# Patient Record
Sex: Male | Born: 2000 | Race: White | Hispanic: No | Marital: Single | Smoking: Current some day smoker
Health system: Southern US, Community
[De-identification: ages and names within clinical notes are randomized; demographics above are authoritative.]

## PROBLEM LIST (undated history)

## (undated) ENCOUNTER — Emergency Department (HOSPITAL_COMMUNITY): Admission: EM | Payer: BC Managed Care – PPO | Source: Home / Self Care

## (undated) DIAGNOSIS — G43909 Migraine, unspecified, not intractable, without status migrainosus: Secondary | ICD-10-CM

## (undated) DIAGNOSIS — F419 Anxiety disorder, unspecified: Secondary | ICD-10-CM

## (undated) DIAGNOSIS — I10 Essential (primary) hypertension: Secondary | ICD-10-CM

## (undated) HISTORY — DX: Migraine, unspecified, not intractable, without status migrainosus: G43.909

---
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
## 2021-03-24 ENCOUNTER — Other Ambulatory Visit: Payer: Self-pay

## 2021-03-24 ENCOUNTER — Emergency Department (HOSPITAL_COMMUNITY)
Admission: EM | Admit: 2021-03-24 | Discharge: 2021-03-25 | Disposition: A | Discharge status: Other Acute Inpatient Hospital | Payer: BC Managed Care – PPO | Source: Home / Self Care | Attending: Emergency Medicine | Admitting: Emergency Medicine

## 2021-03-24 ENCOUNTER — Encounter (HOSPITAL_COMMUNITY): Payer: Self-pay | Admitting: Emergency Medicine

## 2021-03-24 DIAGNOSIS — Z20822 Contact with and (suspected) exposure to covid-19: Secondary | ICD-10-CM | POA: Insufficient documentation

## 2021-03-24 DIAGNOSIS — R45851 Suicidal ideations: Secondary | ICD-10-CM | POA: Insufficient documentation

## 2021-03-24 DIAGNOSIS — F1290 Cannabis use, unspecified, uncomplicated: Secondary | ICD-10-CM | POA: Insufficient documentation

## 2021-03-24 DIAGNOSIS — F419 Anxiety disorder, unspecified: Secondary | ICD-10-CM | POA: Insufficient documentation

## 2021-03-24 DIAGNOSIS — R45850 Homicidal ideations: Secondary | ICD-10-CM | POA: Insufficient documentation

## 2021-03-24 DIAGNOSIS — I10 Essential (primary) hypertension: Secondary | ICD-10-CM | POA: Insufficient documentation

## 2021-03-24 DIAGNOSIS — F332 Major depressive disorder, recurrent severe without psychotic features: Secondary | ICD-10-CM | POA: Insufficient documentation

## 2021-03-24 HISTORY — DX: Anxiety disorder, unspecified: F41.9

## 2021-03-24 HISTORY — DX: Essential (primary) hypertension: I10

## 2021-03-24 LAB — RAPID URINE DRUG SCREEN, HOSP PERFORMED
Amphetamines: NOT DETECTED
Barbiturates: NOT DETECTED
Benzodiazepines: NOT DETECTED
Cocaine: NOT DETECTED
Opiates: NOT DETECTED
Tetrahydrocannabinol: POSITIVE — AB

## 2021-03-24 LAB — COMPREHENSIVE METABOLIC PANEL
ALT: 15 U/L (ref 0–44)
AST: 17 U/L (ref 15–41)
Albumin: 4.7 g/dL (ref 3.5–5.0)
Alkaline Phosphatase: 63 U/L (ref 38–126)
Anion gap: 8 (ref 5–15)
BUN: 6 mg/dL (ref 6–20)
CO2: 30 mmol/L (ref 22–32)
Calcium: 10 mg/dL (ref 8.9–10.3)
Chloride: 102 mmol/L (ref 98–111)
Creatinine, Ser: 0.9 mg/dL (ref 0.61–1.24)
GFR, Estimated: >60 mL/min (ref >60)
Glucose, Bld: 89 mg/dL (ref 70–99)
Potassium: 3.4 mmol/L — ABNORMAL LOW (ref 3.5–5.1)
Sodium: 140 mmol/L (ref 135–145)
Total Bilirubin: 2.3 mg/dL — ABNORMAL HIGH (ref 0.3–1.2)
Total Protein: 7.3 g/dL (ref 6.5–8.1)

## 2021-03-24 LAB — CBC WITH DIFFERENTIAL/PLATELET
Abs Immature Granulocytes: 0.05 10*3/uL (ref 0.00–0.07)
Basophils Absolute: 0.1 10*3/uL (ref 0.0–0.1)
Basophils Relative: 1 %
Eosinophils Absolute: 0 10*3/uL (ref 0.0–0.5)
Eosinophils Relative: 1 %
HCT: 49.6 % (ref 39.0–52.0)
Hemoglobin: 16.9 g/dL (ref 13.0–17.0)
Immature Granulocytes: 1 %
Lymphocytes Relative: 30 %
Lymphs Abs: 1.8 10*3/uL (ref 0.7–4.0)
MCH: 30.9 pg (ref 26.0–34.0)
MCHC: 34.1 g/dL (ref 30.0–36.0)
MCV: 90.7 fL (ref 80.0–100.0)
Monocytes Absolute: 0.5 10*3/uL (ref 0.1–1.0)
Monocytes Relative: 8 %
Neutro Abs: 3.5 10*3/uL (ref 1.7–7.7)
Neutrophils Relative %: 59 %
Platelets: 246 10*3/uL (ref 150–400)
RBC: 5.47 MIL/uL (ref 4.22–5.81)
RDW: 12.7 % (ref 11.5–15.5)
WBC: 6 10*3/uL (ref 4.0–10.5)
nRBC: 0 % (ref 0.0–0.2)

## 2021-03-24 LAB — RESP PANEL BY RT-PCR (FLU A&B, COVID) ARPGX2
Influenza A by PCR: NEGATIVE
Influenza B by PCR: NEGATIVE
SARS Coronavirus 2 by RT PCR: NEGATIVE

## 2021-03-24 LAB — ETHANOL: Alcohol, Ethyl (B): <10 mg/dL (ref <10)

## 2021-03-24 NOTE — ED Provider Notes (Signed)
Emergency Medicine Provider Triage Evaluation Note  [name] , a 20 y.o. male  was evaluated in triage.  Pt complains of SI/HI thoughts.  Patient actively has plan to shoot himself, however denies access to firearms.  States he also has intrusive thoughts about hurting other people.  Denies any attempt to do so at this time.  Denies auditory or visual hallucinations.  Review of Systems  Positive: SI/HI thoughts Negative: Auditory or visual hallucinations  Physical Exam  BP (!) 141/92   Pulse 85   Temp 98.4 F (36.9 C) (Oral)   Resp 18   Ht 6' (1.829 m)   Wt 92 kg   SpO2 100%   BMI 27.51 kg/m  Gen:   Awake, no distress   Resp:  Normal effort  MSK:   Moves extremities without difficulty    Medical Decision Making  Medically screening exam initiated at 9:36 PM.  Appropriate orders placed.  [name] was informed that the remainder of the evaluation will be completed by another provider, this initial triage assessment does not replace that evaluation, and the importance of remaining in the ED until their evaluation is complete.     Vear Clock 03/24/21 2138    Virgina Norfolk, DO 03/24/21 2311

## 2021-03-24 NOTE — ED Triage Notes (Signed)
Patient arrived with EMS from home reports suicidal ideation this week , he did not specify his plan at triage , denies hallucinations .

## 2021-03-24 NOTE — ED Notes (Signed)
William [name] [phone] would like an update

## 2021-03-25 ENCOUNTER — Other Ambulatory Visit: Payer: Self-pay | Admitting: Psychiatry

## 2021-03-25 ENCOUNTER — Encounter (HOSPITAL_COMMUNITY): Payer: Self-pay | Admitting: Emergency Medicine

## 2021-03-25 ENCOUNTER — Inpatient Hospital Stay (HOSPITAL_COMMUNITY)
Admission: AD | Admit: 2021-03-25 | Discharge: 2021-04-01 | DRG: 885 | Disposition: A | Discharge status: Home / Self Care | Payer: BC Managed Care – PPO | Source: Intra-hospital | Attending: Emergency Medicine | Admitting: Emergency Medicine

## 2021-03-25 DIAGNOSIS — R45850 Homicidal ideations: Secondary | ICD-10-CM | POA: Diagnosis present

## 2021-03-25 DIAGNOSIS — I10 Essential (primary) hypertension: Secondary | ICD-10-CM | POA: Diagnosis present

## 2021-03-25 DIAGNOSIS — G43909 Migraine, unspecified, not intractable, without status migrainosus: Secondary | ICD-10-CM | POA: Diagnosis present

## 2021-03-25 DIAGNOSIS — Z20822 Contact with and (suspected) exposure to covid-19: Secondary | ICD-10-CM | POA: Diagnosis present

## 2021-03-25 DIAGNOSIS — R45851 Suicidal ideations: Secondary | ICD-10-CM | POA: Diagnosis present

## 2021-03-25 DIAGNOSIS — F1290 Cannabis use, unspecified, uncomplicated: Secondary | ICD-10-CM | POA: Diagnosis present

## 2021-03-25 DIAGNOSIS — Z801 Family history of malignant neoplasm of trachea, bronchus and lung: Secondary | ICD-10-CM

## 2021-03-25 DIAGNOSIS — F332 Major depressive disorder, recurrent severe without psychotic features: Principal | ICD-10-CM | POA: Diagnosis present

## 2021-03-25 DIAGNOSIS — F23 Brief psychotic disorder: Principal | ICD-10-CM | POA: Diagnosis present

## 2021-03-25 MED ORDER — ONDANSETRON HCL 4 MG PO TABS: 4.0000 mg | ORAL_TABLET | Freq: Three times a day (TID) | ORAL | Status: DC | PRN

## 2021-03-25 MED ORDER — ACETAMINOPHEN 325 MG PO TABS: 650.0000 mg | ORAL_TABLET | ORAL | Status: DC | PRN

## 2021-03-25 MED ORDER — HYDROXYZINE HCL 25 MG PO TABS
25.0000 mg | ORAL_TABLET | Freq: Three times a day (TID) | ORAL | Status: DC | PRN
  Administered 2021-03-25 – 2021-03-31 (×15): 25 mg via ORAL
  Filled 2021-03-25 (×15): qty 1

## 2021-03-25 MED ORDER — ALUM & MAG HYDROXIDE-SIMETH 200-200-20 MG/5ML PO SUSP: 30.0000 mL | Freq: Four times a day (QID) | ORAL | Status: DC | PRN

## 2021-03-25 MED ORDER — NICOTINE POLACRILEX 2 MG MT GUM
2.0000 mg | CHEWING_GUM | OROMUCOSAL | Status: DC | PRN
  Administered 2021-03-25 – 2021-04-01 (×29): 2 mg via ORAL
  Filled 2021-03-25 (×12): qty 1
  Filled 2021-03-25: qty 10
  Filled 2021-03-25: qty 1

## 2021-03-25 NOTE — BH Assessment (Addendum)
Patient accepted to [facility] by Nira Conn, FNP and attending will be Dr. Mason Jim.  Nurse call report to [phone].  Pt can come after 09:00.

## 2021-03-25 NOTE — Tx Team (Signed)
Initial Treatment Plan 03/25/2021 2:23 PM William [name] OVZ:[id number]    PATIENT STRESSORS: Financial difficulties   Health problems   Medication change or noncompliance   Occupational concerns     PATIENT STRENGTHS: Ability for insight  Communication skills  Motivation for treatment/growth  Supportive family/friends    PATIENT IDENTIFIED PROBLEMS: "Fix my intrusive thoughts"  Suicidal thoughts  Depression  Anxiety               DISCHARGE CRITERIA:  Ability to meet basic life and health needs Adequate post-discharge living arrangements Motivation to continue treatment in a less acute level of care  PRELIMINARY DISCHARGE PLAN: Attend aftercare/continuing care group Outpatient therapy Return to previous living arrangement  PATIENT/FAMILY INVOLVEMENT: This treatment plan has been presented to and reviewed with the patient, William [name], and/or family member.  The patient and family have been given the opportunity to ask questions and make suggestions.  Clarene Critchley, RN 03/25/2021, 2:23 PM

## 2021-03-25 NOTE — ED Provider Notes (Signed)
[facility] EMERGENCY DEPARTMENT Provider Note   CSN: [id number] Arrival date & time: 03/24/21  2103     History Chief Complaint  Patient presents with  . Suicidal    William [name] is a 20 y.o. male.  The history is provided by the patient.  Mental Health Problem Presenting symptoms: homicidal ideas and suicidal thoughts   Presenting symptoms: no aggressive behavior and no suicide attempt   Patient accompanied by: none. Degree of incapacity (severity):  Moderate Onset quality:  Gradual Duration: days. Timing:  Constant Progression:  Unchanged Chronicity:  New Context: not alcohol use and not noncompliant   Treatment compliance:  Untreated Time since last dose of psychoactive medication: never. Worsened by:  Nothing Ineffective treatments:  None tried Associated symptoms: no decreased need for sleep and no euphoric mood   Risk factors: no hx of suicide attempts       Past Medical History:  Diagnosis Date  . Anxiety   . Hypertension   . Migraine     There are no problems to display for this patient.   No past surgical history on file.     No family history on file.  Social History   Tobacco Use  . Smoking status: Never  Substance Use Topics  . Alcohol use: Never  . Drug use: Never    Home Medications Prior to Admission medications   Medication Sig Start Date End Date Taking? Authorizing Provider  naproxen (NAPROSYN) 500 MG tablet Take 1 tablet (500 mg total) by mouth 2 (two) times daily with a meal. 08/06/14   Didiano, Deanna M, DO    Allergies    Patient has no known allergies.  Review of Systems   Review of Systems  Constitutional:  Negative for fever.  HENT:  Negative for facial swelling.   Eyes:  Negative for redness.  Respiratory:  Negative for wheezing and stridor.   Cardiovascular:  Negative for leg swelling.  Gastrointestinal:  Negative for nausea and vomiting.  Genitourinary:  Negative for difficulty urinating.   Musculoskeletal:  Negative for neck stiffness.  Skin:  Negative for rash.  Neurological:  Negative for facial asymmetry.  Psychiatric/Behavioral:  Positive for homicidal ideas and suicidal ideas.   All other systems reviewed and are negative.  Physical Exam Updated Vital Signs BP (!) 141/92   Pulse 85   Temp 98.4 F (36.9 C) (Oral)   Resp 18   Ht 6' (1.829 m)   Wt 92 kg   SpO2 100%   BMI 27.51 kg/m   Physical Exam Vitals and nursing note reviewed.  Constitutional:      General: He is not in acute distress.    Appearance: Normal appearance.  HENT:     Head: Normocephalic and atraumatic.     Nose: Nose normal.  Eyes:     Conjunctiva/sclera: Conjunctivae normal.     Pupils: Pupils are equal, round, and reactive to light.  Cardiovascular:     Rate and Rhythm: Normal rate and regular rhythm.     Pulses: Normal pulses.     Heart sounds: Normal heart sounds.  Pulmonary:     Effort: Pulmonary effort is normal.     Breath sounds: Normal breath sounds.  Abdominal:     General: Abdomen is flat. Bowel sounds are normal.     Palpations: Abdomen is soft.     Tenderness: There is no abdominal tenderness. There is no guarding.  Musculoskeletal:        General: Normal  range of motion.     Cervical back: Normal range of motion and neck supple.  Skin:    General: Skin is warm and dry.     Capillary Refill: Capillary refill takes less than 2 seconds.  Neurological:     General: No focal deficit present.     Mental Status: He is alert and oriented to person, place, and time.     Deep Tendon Reflexes: Reflexes normal.  Psychiatric:        Mood and Affect: Mood normal.        Behavior: Behavior normal.    ED Results / Procedures / Treatments   Labs (all labs ordered are listed, but only abnormal results are displayed) Results for orders placed or performed during the hospital encounter of 03/24/21  Resp Panel by RT-PCR (Flu A&B, Covid) Nasopharyngeal Swab   Specimen:  Nasopharyngeal Swab; Nasopharyngeal(NP) swabs in vial transport medium  Result Value Ref Range   SARS Coronavirus 2 by RT PCR NEGATIVE NEGATIVE   Influenza A by PCR NEGATIVE NEGATIVE   Influenza B by PCR NEGATIVE NEGATIVE  Comprehensive metabolic panel  Result Value Ref Range   Sodium 140 135 - 145 mmol/L   Potassium 3.4 (L) 3.5 - 5.1 mmol/L   Chloride 102 98 - 111 mmol/L   CO2 30 22 - 32 mmol/L   Glucose, Bld 89 70 - 99 mg/dL   BUN 6 6 - 20 mg/dL   Creatinine, Ser 7.62 0.61 - 1.24 mg/dL   Calcium 83.1 8.9 - 51.7 mg/dL   Total Protein 7.3 6.5 - 8.1 g/dL   Albumin 4.7 3.5 - 5.0 g/dL   AST 17 15 - 41 U/L   ALT 15 0 - 44 U/L   Alkaline Phosphatase 63 38 - 126 U/L   Total Bilirubin 2.3 (H) 0.3 - 1.2 mg/dL   GFR, Estimated >61 >60 mL/min   Anion gap 8 5 - 15  Ethanol  Result Value Ref Range   Alcohol, Ethyl (B) <10 <10 mg/dL  Urine rapid drug screen (hosp performed)  Result Value Ref Range   Opiates NONE DETECTED NONE DETECTED   Cocaine NONE DETECTED NONE DETECTED   Benzodiazepines NONE DETECTED NONE DETECTED   Amphetamines NONE DETECTED NONE DETECTED   Tetrahydrocannabinol POSITIVE (A) NONE DETECTED   Barbiturates NONE DETECTED NONE DETECTED  CBC with Diff  Result Value Ref Range   WBC 6.0 4.0 - 10.5 K/uL   RBC 5.47 4.22 - 5.81 MIL/uL   Hemoglobin 16.9 13.0 - 17.0 g/dL   HCT 73.7 10.6 - 26.9 %   MCV 90.7 80.0 - 100.0 fL   MCH 30.9 26.0 - 34.0 pg   MCHC 34.1 30.0 - 36.0 g/dL   RDW 48.5 46.2 - 70.3 %   Platelets 246 150 - 400 K/uL   nRBC 0.0 0.0 - 0.2 %   Neutrophils Relative % 59 %   Neutro Abs 3.5 1.7 - 7.7 K/uL   Lymphocytes Relative 30 %   Lymphs Abs 1.8 0.7 - 4.0 K/uL   Monocytes Relative 8 %   Monocytes Absolute 0.5 0.1 - 1.0 K/uL   Eosinophils Relative 1 %   Eosinophils Absolute 0.0 0.0 - 0.5 K/uL   Basophils Relative 1 %   Basophils Absolute 0.1 0.0 - 0.1 K/uL   Immature Granulocytes 1 %   Abs Immature Granulocytes 0.05 0.00 - 0.07 K/uL   No results  found.  Procedures Procedures   Medications Ordered in ED Medications  alum &  mag hydroxide-simeth (MAALOX/MYLANTA) 200-200-20 MG/5ML suspension 30 mL (has no administration in time range)  acetaminophen (TYLENOL) tablet 650 mg (has no administration in time range)  ondansetron (ZOFRAN) tablet 4 mg (has no administration in time range)    ED Course  I have reviewed the triage vital signs and the nursing notes.  Pertinent labs & imaging results that were available during my care of the patient were reviewed by me and considered in my medical decision making (see chart for details). The patient has been placed in psychiatric observation due to the need to provide a safe environment for the patient while obtaining psychiatric consultation and evaluation, as well as ongoing medical and medication management to treat the patient's condition.  The patient has not been placed under full IVC at this time.   Final Clinical Impression(s) / ED Diagnoses Final diagnoses:  Suicidal ideation   Cleared mentally for TTS orders placed   Rx / DC Orders ED Discharge Orders     None        Mandy Peeks, MD 03/25/21 2119
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
## 2021-03-25 NOTE — Progress Notes (Signed)
Dr. Loleta Chance informed of abnormal EKG reading. Advised to obtain repeat EKG in AM. Monitor and notify MD if patient becomes symptomatic or if vital signs changes occur. Assigned RN Meriam Sprague, aware and verbalizes understanding.

## 2021-03-25 NOTE — ED Notes (Signed)
Attempted to call report to [facility]. Nurse unable to receive at this time.

## 2021-03-25 NOTE — Progress Notes (Signed)
Admission Note: Patient is a 20 year old male admitted to the unit unit voluntary status for symptoms of depression and suicidal ideation.  Patient currently denies suicidal thoughts and verbally contracts for safety while in the hospital.  Patient is alert and oriented x 4.  Presents with anxious affect and mood.  Stated that he is tired of having intrusive thoughts of hurting self and others.  Asking for help in getting rid of the thoughts so he can have a normal life.  Admission plan of care reviewed with consent for treatment signed.  Skin and personal belongings completed.  Skin is dry and intact.  No contraband found.  Patient oriented to the unit, staff and room.  Routine safety checks initiated.  Verbalizes understanding of unit rules/protocols.  Patient is safe on the unit at this time.  Support and encouragement offered.

## 2021-03-25 NOTE — Progress Notes (Signed)
   03/25/21 2136  Psych Admission Type (Psych Patients Only)  Admission Status Voluntary  Psychosocial Assessment  Patient Complaints Self-harm thoughts;Anxiety;Sadness  Eye Contact Brief  Facial Expression Anxious;Pensive;Sullen;Sad  Affect Appropriate to circumstance;Anxious;Sad  Speech Logical/coherent  Interaction Minimal  Motor Activity Other (Comment) (WDL)  Appearance/Hygiene In scrubs  Behavior Characteristics Cooperative;Appropriate to situation  Mood Anxious;Sad  Thought Process  Coherency WDL  Content WDL  Delusions None reported or observed  Perception WDL  Hallucination None reported or observed  Judgment WDL  Confusion None  Danger to Self  Current suicidal ideation? Passive  Self-Injurious Behavior No self-injurious ideation or behavior indicators observed or expressed   Agreement Not to Harm Self Yes  Description of Agreement Verbal contract  Danger to Others  Danger to Others None reported or observed

## 2021-03-25 NOTE — ED Notes (Signed)
Patient continues with TTS assessment.

## 2021-03-25 NOTE — Group Note (Signed)
LCSW Group Therapy Note   Group Date: 03/25/2021 Start Time: 1300 End Time: 1400   [facility] LCSW Group Therapy Note   Type of Therapy and Topic:  Group Therapy:  Self-Care Wheel  Participation Level:  Did not attend  Description of Group This process group involved patients discussing the importance of self-care in different areas of life (professional, personal, emotional, psychological, spiritual, and physical) in order to achieve healthy life balance.  The group talked about what self-care in each of those areas would constitute and then specifically listed how they want to provide themselves with improved self-care in this new year.      Therapeutic Goals Patient will learn how to break self-care down into various areas of life Patient will participate in generating ideas about healthy self-care options in each category Patients will be supportive of one another and receive support from others Patient will identify one healthy self-care activity to add to his/her life this year  Summary of Patient Progress:  Did not attend  Therapeutic Modalities Processing Psychoeducation  Otelia Santee, LCSW 03/25/2021  1:20 PM

## 2021-03-25 NOTE — [facility] Group Notes (Signed)
Pt did not attend group. 

## 2021-03-26 DIAGNOSIS — F1290 Cannabis use, unspecified, uncomplicated: Secondary | ICD-10-CM | POA: Diagnosis not present

## 2021-03-26 DIAGNOSIS — F332 Major depressive disorder, recurrent severe without psychotic features: Secondary | ICD-10-CM | POA: Diagnosis not present

## 2021-03-26 MED ORDER — BUPROPION HCL ER (XL) 150 MG PO TB24
150.0000 mg | ORAL_TABLET | Freq: Every day | ORAL | Status: DC
  Administered 2021-03-27 – 2021-03-29 (×3): 150 mg via ORAL
  Filled 2021-03-26 (×5): qty 1

## 2021-03-26 MED ORDER — BUPROPION HCL 100 MG PO TABS
100.0000 mg | ORAL_TABLET | Freq: Once | ORAL | Status: AC
  Administered 2021-03-26: 100 mg via ORAL
  Filled 2021-03-26 (×2): qty 1

## 2021-03-26 MED ORDER — HYDROXYZINE HCL 50 MG PO TABS
50.0000 mg | ORAL_TABLET | Freq: Every evening | ORAL | Status: DC | PRN
  Administered 2021-03-26 – 2021-03-28 (×3): 50 mg via ORAL
  Filled 2021-03-26 (×3): qty 1

## 2021-03-26 MED ORDER — ACETAMINOPHEN 325 MG PO TABS
650.0000 mg | ORAL_TABLET | Freq: Four times a day (QID) | ORAL | Status: DC | PRN
  Administered 2021-03-26 – 2021-04-01 (×3): 650 mg via ORAL
  Filled 2021-03-26 (×3): qty 2

## 2021-03-26 MED ORDER — BUPROPION HCL ER (XL) 150 MG PO TB24: 150.0000 mg | ORAL_TABLET | Freq: Every day | ORAL | Status: DC
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
## 2021-03-26 NOTE — Progress Notes (Signed)
Pt did not attend orientation/goals group. 

## 2021-03-26 NOTE — Progress Notes (Signed)
Pt was given mindfulness skills packet to go over, review and ask questions for psycho-ed group. 

## 2021-03-26 NOTE — Group Note (Signed)
LCSW Group Therapy Note   Group Date: 03/26/2021 Start Time: 1015am End Time: 1115am   Type of Therapy and Topic:  Group Therapy:   Participation Level:  Did Not Attend  Description of Group:  In group today, the group was introduced to the concept of "Unhelpful Thinking Styles."  A few specific types of cognitive distortions were described and discussed including Mental Filter, Jumping to Conclusions, Personalization, Catastrophizing, Black and White Thinking, Shoulding and Musting, Labeling, Overgeneralizing, and Disqualifying/Ignoring the Positives.  Examples were given of each and the group was asked to provide personal examples.  Patients were then asked to think about which of these types of thinking they tend to utilize and how that might affect their interactions with people in their lives.  Therapeutic Goals:  1.  Learn about cognitive distortions. 2.   Allow patients the opportunity to reflect on patterns of unhelpful thinking they may engage in. 3.  Discuss the types of unhelpful thinking that individual patients tend to use. 4.  Encourage patients to continue to consider how their cognitive distortions may influence their relationships and what steps they may wish to take to make changes in their life for better outcomes.   Summary of Patient Progress:    Patient was invited to group, did not attend.  Therapeutic Modalities:  Psychoeducation Processing  Lynnell Chad, Theresia Majors 03/26/2021

## 2021-03-26 NOTE — [facility] Suicide Risk Assessment (Signed)
[facility] Admission Suicide Risk Assessment   Nursing information obtained from:  Patient Demographic factors:  Adolescent or young adult Current Mental Status:  Self-harm thoughts Loss Factors:  Financial problems / change in socioeconomic status, Loss of significant relationship, Decrease in vocational status Historical Factors:  Impulsivity Risk Reduction Factors:  Living with another person, especially a relative  Total Time spent with patient: 1.5 hours Principal Problem: MDD (major depressive disorder), recurrent severe, without psychosis (HCC) Diagnosis:  Principal Problem:   MDD (major depressive disorder), recurrent severe, without psychosis (HCC) Active Problems:   Marijuana use  Subjective Data:  "I feel like I can control my body but not my mind."    Continued Clinical Symptoms:    The "Alcohol Use Disorders Identification Test", Guidelines for Use in Primary Care, Second Edition.  World Science writer [facility]). Score between 0-7:  no or low risk or alcohol related problems. Score between 8-15:  moderate risk of alcohol related problems. Score between 16-19:  high risk of alcohol related problems. Score 20 or above:  warrants further diagnostic evaluation for alcohol dependence and treatment.   CLINICAL FACTORS:   Severe Anxiety and/or Agitation Depression:   Hopelessness Severe Alcohol/Substance Abuse/Dependencies Unstable or Poor Therapeutic Relationship   Musculoskeletal: Strength & Muscle Tone: within normal limits Gait & Station: normal Patient leans: N/A  Psychiatric Specialty Exam:  Presentation  General Appearance:  Appropriate for Environment; Casual Eye Contact: Fair Speech: Normal Rate Speech Volume: Normal Handedness: Right  Mood and Affect  Mood: Anxious; Depressed; Hopeless; Worthless Affect: Librarian, academic Processes: Coherent Descriptions of Associations:Intact Orientation:Full (Time, Place and  Person) Thought Content:Perseveration; Rumination History of Schizophrenia/Schizoaffective disorder:No  Duration of Psychotic Symptoms:No data recorded Hallucinations:Hallucinations: None Ideas of Reference:None Suicidal Thoughts:Suicidal Thoughts: Yes, Passive SI Passive Intent and/or Plan: Without Intent; Without Plan; Without Means to Carry Out; Without Access to Means Homicidal Thoughts:Homicidal Thoughts: Yes, Passive HI Passive Intent and/or Plan: Without Intent; Without Plan; Without Means to Carry Out; Without Access to Means  Sensorium  Memory: Immediate Good; Recent Good; Remote Good Judgment: Fair Insight: Fair  Art therapist  Concentration: Fair Attention Span: Fair Recall: Good Fund of Knowledge: Good Language: Good  Psychomotor Activity  Psychomotor Activity: Psychomotor Activity: Decreased  Assets  Assets: Communication Skills; Desire for Improvement; Housing; Physical Health  Sleep  Sleep: Sleep: Fair Number of Hours of Sleep: 6.25   Physical Exam: Physical Exam ROS Vitals and nursing note reviewed.  Constitutional:      Appearance: Normal appearance.  HENT:     Head: Normocephalic and atraumatic.     Nose: Nose normal.  Cardiovascular:     Rate and Rhythm: Normal rate.  Pulmonary:     Effort: Pulmonary effort is normal. No respiratory distress.  Musculoskeletal:        General: Normal range of motion.     Cervical back: Normal range of motion.  Neurological:     General: No focal deficit present.     Mental Status: He is alert and oriented to person, place, and time.    Review of Systems  Constitutional: Negative.   Respiratory: Negative.    Neurological: Negative.   Psychiatric/Behavioral:  Positive for depression, substance abuse and suicidal ideas. Negative for hallucinations and memory loss. The patient is nervous/anxious. The patient does not have insomnia.   Blood pressure 126/90, pulse 100, temperature 98.4 F (36.9  C), temperature source Oral, resp. rate 18, height 6' (1.829 m), weight 83.9 kg, SpO2 97 %.  Body mass index is 25.09 kg/m.   COGNITIVE FEATURES THAT CONTRIBUTE TO RISK:  Polarized thinking    SUICIDE RISK:   Mild:  Suicidal ideation of limited frequency, intensity, duration, and specificity.  There are no identifiable plans, no associated intent, mild dysphoria and related symptoms, good self-control (both objective and subjective assessment), few other risk factors, and identifiable protective factors, including available and accessible social support.  PLAN OF CARE:  Daily contact with patient to assess and evaluate symptoms and progress in treatment and Medication management   Observation Level/Precautions:  15 minute checks  Laboratory:   Reviewed from emergency department- unremarkable other than urine drug screen positive for marijuana  Psychotherapy: Encouraged to interact in the milieu and attend group therapies  Medications: Medication education provided, and patient agreeable to trial of bupropion and hydroxyzine  Consultations: If indicated  Discharge Concerns: Housing and employment  Estimated LOS: 3-7 days  Other: Patient would benefit from psycho therapy.    Physician Treatment Plan for Primary Diagnosis: MDD (major depressive disorder), recurrent severe, without psychosis (HCC) Long Term Goal(s): Improvement in symptoms so as ready for discharge   Short Term Goals: Ability to identify changes in lifestyle to reduce recurrence of condition will improve, Ability to verbalize feelings will improve, Ability to disclose and discuss suicidal ideas, Ability to demonstrate self-control will improve, Ability to identify and develop effective coping behaviors will improve, Ability to maintain clinical measurements within normal limits will improve, and Ability to identify triggers associated with substance abuse/mental health issues will improve   Physician Treatment Plan for  Secondary Diagnosis: Principal Problem:   MDD (major depressive disorder), recurrent severe, without psychosis (HCC) Active Problems:   Marijuana use   Long Term Goal(s): Improvement in symptoms so as ready for discharge   Short Term Goals: Ability to identify changes in lifestyle to reduce recurrence of condition will improve, Ability to verbalize feelings will improve, Ability to disclose and discuss suicidal ideas, Ability to demonstrate self-control will improve, Ability to identify and develop effective coping behaviors will improve, Ability to maintain clinical measurements within normal limits will improve, and Ability to identify triggers associated with substance abuse/mental health issues will improve     I certify that inpatient services furnished can reasonably be expected to improve the patient's condition.   Mariel Craft, MD 03/26/2021, 6:15 PM

## 2021-03-26 NOTE — Progress Notes (Signed)
Adult Psychoeducational Group Note  Date:  03/26/2021 Time:  9:09 PM  Group Topic/Focus:  Wrap-Up Group:   The focus of this group is to help patients review their daily goal of treatment and discuss progress on daily workbooks.  Participation Level:  Active  Participation Quality:  Appropriate  Affect:  Appropriate  Cognitive:  Appropriate  Insight: Appropriate  Engagement in Group:  Engaged  Modes of Intervention:  Education  Additional Comments:  Patient attended and participated in group tonight. He reports that today he received medication for anxiety and that helped a lot  William [name] 03/26/2021, 9:09 PM

## 2021-03-26 NOTE — Progress Notes (Signed)
D: Pt alert and oriented. Pt denies experiencing any anxiety/depression at this time. Pt denies experiencing any pain at this time. Pt denies experiencing any SI/HI, or AVH at this time.    A: Scheduled medications administered to pt, per MD orders. Support and encouragement provided. Frequent verbal contact made. Routine safety checks conducted q15 minutes.   R: No adverse drug reactions noted. Pt verbally contracts for safety at this time. Pt complaint with medications. Pt interacts minimally with others on the unit. Pt remains safe at this time. Will continue to monitor.  
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
## 2021-03-27 DIAGNOSIS — F23 Brief psychotic disorder: Secondary | ICD-10-CM | POA: Insufficient documentation

## 2021-03-27 LAB — LIPID PANEL
Cholesterol: 136 mg/dL (ref 0–200)
HDL: 38 mg/dL — ABNORMAL LOW (ref >40)
LDL Cholesterol: 87 mg/dL (ref 0–99)
Total CHOL/HDL Ratio: 3.6 RATIO
Triglycerides: 56 mg/dL (ref <150)
VLDL: 11 mg/dL (ref 0–40)

## 2021-03-27 LAB — HEMOGLOBIN A1C
Hgb A1c MFr Bld: 5 % (ref 4.8–5.6)
Mean Plasma Glucose: 96.8 mg/dL

## 2021-03-27 LAB — TSH: TSH: 4.31 u[IU]/mL (ref 0.350–4.500)

## 2021-03-27 MED ORDER — RISPERIDONE 3 MG PO TABS
3.0000 mg | ORAL_TABLET | Freq: Every day | ORAL | Status: DC
  Administered 2021-03-27 – 2021-03-28 (×2): 3 mg via ORAL
  Filled 2021-03-27 (×3): qty 1

## 2021-03-27 MED ORDER — RISPERIDONE 1 MG PO TABS
1.0000 mg | ORAL_TABLET | Freq: Every day | ORAL | Status: DC
  Administered 2021-03-27 – 2021-03-28 (×2): 1 mg via ORAL
  Filled 2021-03-27 (×3): qty 1

## 2021-03-27 NOTE — Progress Notes (Signed)
Pt presents animated but anxious on interactions. Rates his depression 8/10, hopelessness 4/10 and anxiety 3/10 with current stressors being recent events leading to admission "I have to make sure to find a job and fix my car when I leave here". Pt's goal for today is "To make a switch to a positive mindset". Reports fair sleep last night with good appetite, normal energy and good concentration level. Denies VH and pain "My back and legs hurt when I wake up but I'm fine now". Endorsed + AH "just voices", passive SI & HI. Verbally contracts for safety when assessed. Visible in noon group, went off with peers for meals, returned without issues. Interacts well with peers and staff. Showered on returned to unit. Tolerates meals and fluids well. Compliant with medications when offered. Denies adverse drug reactions when assessed. Support and encouragement offered. Q 15 minutes safety checks maintained without outburst or self harm gestures. All medications given with verbal education and effects monitored.  Pt showered, changed his clothes. Remains safe on and off unit. Denies concerns at this time.

## 2021-03-27 NOTE — [facility] Group Notes (Signed)
Adult Psychoeducational Group Note  Date:  03/27/2021 Time:  2:59 PM  Group Topic/Focus:  Coping With Mental Health Crisis:   The purpose of this group is to help patients identify strategies for coping with mental health crisis.  Group discusses possible causes of crisis and ways to manage them effectively.  Participation Level:  Active  Participation Quality:  Appropriate  Affect:  Appropriate  Cognitive:  Appropriate  Insight: Appropriate  Engagement in Group:  Engaged  Modes of Intervention:  Discussion  Additional Comments:  This Psychoeducational group was facilitated by [facility] Jonni Sanger. This group focused on personal boundaries, rigid  boundaries, porous boundaries and healthy boundaries. All patients listened and shared what boundaries mean to them. Pts also identified coping skills and managing through crisis.  William [name] 03/27/2021, 2:59 PM

## 2021-03-27 NOTE — [facility] Group Notes (Signed)
Pt did not attend orientation/goals group. 
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
## 2021-03-27 NOTE — Group Note (Signed)
[facility] LCSW Group Therapy Note  Date/Time:  03/27/2021 10:00-11:00AM  Type of Therapy and Topic:  Group Therapy:  Healthy and Unhealthy Supports   Participation Level:  Did Not Attend   Description of Group: Patients in this group were invited to identify the differences between healthy and unhealthy supports and then to identify those people in their lives who fall into one category or the other, as well as why.  They were then introduced to the idea of adding more healthy supports and decreasing the unhealthy ones.  Patients discussed what additional healthy supports could be helpful in their recovery and wellness after discharge in order to maintain stability.   An emphasis was placed on using counselor, doctor, therapy groups, 12-step groups, and problem-specific support groups to expand supports.  At the end of group, a song "Love Myself" was played to encourage full participation in their own recovery journey.  Therapeutic Goals:   1)  discuss importance of adding supports to stay well once out of the hospital  2)  compare healthy versus unhealthy supports and identify some examples of each  3)  generate ideas and descriptions of healthy supports that can be added  4)  offer mutual support about how to address unhealthy supports  5)  encourage active participation in and adherence to discharge plan    Summary of Patient Progress:  The patient was invited to group, did not attend.   Therapeutic Modalities:   Brief Solution-Focused Therapy  Niketa Turner Grossman-Orr, LCSW 

## 2021-03-27 NOTE — Progress Notes (Signed)
Psychoeducational Group Note  Date:  03/27/2021 Time:  2015  Group Topic/Focus:  Wrap up group  Participation Level: Did Not Attend  Participation Quality:  Not Applicable  Affect:  Not Applicable  Cognitive:  Not Applicable  Insight:  Not Applicable  Engagement in Group: Not Applicable  Additional Comments:  Did not attend.   Marcille Buffy 03/27/2021, 8:55 PM

## 2021-03-28 ENCOUNTER — Encounter (HOSPITAL_COMMUNITY): Payer: Self-pay

## 2021-03-28 NOTE — Progress Notes (Signed)
Pt up requesting something to help him sleep, pt given PRN Vistaril per [facility] per mar. Pt educated on medication helping to relax pt and could possibly fall asleep

## 2021-03-28 NOTE — Plan of Care (Signed)
Nurse discussed anxiety, depression and coping skills with patient.  

## 2021-03-28 NOTE — Group Note (Signed)
Occupational Therapy Group Note  Group Topic:Feelings Management  Group Date: 03/28/2021 Start Time: 1400 End Time: 1450 Facilitators: Donne Hazel, OT/L    Group Description:Group encouraged increased engagement and participation through discussion focused on Building Happiness. Patients were provided a handout and reviewed therapeutic strategies to build happiness including identifying gratitudes, random acts of kindness, exercise, meditation, positive journaling, and fostering relationships. Patients engaged in discussion and encouraged to reflect on each strategy and their experiences.  Therapeutic Goal(s): Identify strategies to build happiness. Identify and implement therapeutic strategies to improve overall mood. Practice and identify gratitudes, random acts of kindness, exercise, meditation, positive journaling, and fostering relationships     Participation Level: Active   Participation Quality: Independent   Behavior: Cooperative and Interactive    Speech/Thought Process: Focused   Affect/Mood: Euthymic   Insight: Moderate   Judgement: Moderate   Individualization: William [name] was active in their participation of group discussion/activity. Pt identified "being with my friends and family" and "playing basketball or video games" as things that bring him happiness. Appeared receptive to additional strategies discussed/reviewed.   Modes of Intervention: Activity, Discussion, and Education  Patient Response to Interventions:  Attentive, Engaged, Interested , and Receptive   Plan: Continue to engage patient in OT groups 2 - 3x/week.  03/28/2021  Donne Hazel, OT/L

## 2021-03-28 NOTE — Group Note (Signed)
Recreation Therapy Group Note   Group Topic:Team Building  Group Date: 03/28/2021 Start Time: 1000 End Time: 1045 Facilitators: Caroll Rancher, LRT/CTRS Location: [address] Dayroom   Goal Area(s) Addresses:  Patient will effectively work with peer towards shared goal.  Patient will identify skills used to make activity successful.  Patient will share challenges and verbalize solution-driven approaches used. Patient will identify how skills used during activity can be used to reach post d/c goals.   Group Description: [employer]. Patients were provided the following materials: 2 drinking straws, 5 rubber bands, 5 paper clips, 2 index cards and 2 drinking cups. Using the provided materials patients were asked to build a launching mechanism to launch a ping pong ball across the room, approximately 10 feet. Patients were divided into teams of 3-5. Instructions required all materials be incorporated into the device, functionality of items left to the peer group's discretion.   Affect/Mood: Appropriate and Tired   Participation Level: Active and Engaged   Participation Quality: Independent   Behavior: Appropriate and Attentive    Speech/Thought Process: Focused   Insight: Good   Judgement: Good   Modes of Intervention: Group work and Team-building   Patient Response to Interventions:  Air cabin crew   Education Outcome:  Acknowledges education and In group clarification offered    Clinical Observations/Individualized Feedback:  Pt seemed tired when first arrived to group.  Pt became more lively as group when along.  Pt worked well with peers in completing their launcher.  Pt was attentive, engaged and took turns with peers coming up ideas and concepts to complete the activity.  LRT was unable to complete processing due to fire drill.     Plan: Continue to engage patient in RT group sessions 2-3x/week.   Caroll Rancher, LRT/CTRS 03/28/2021 12:18 PM

## 2021-03-28 NOTE — [facility] Group Notes (Signed)
Patient attended morning orientation/goals group and participated.  

## 2021-03-28 NOTE — Progress Notes (Signed)
D:  Patient's self inventory sheet, patient has fair sleep, sleep medication helpful.  Good appetite, normal energy level, good concentration.  Rated depression 2, hopeless 1, anxiety 8.  Denied withdrawals.  Denied SI.  Denied physical problems.  Denied physical pain.  Goal is positive mindset.  Plans to stay positive.  No discharge plans. A:  Medications administered per MD orders.  Emotional support and encouragement given patient. R:  Denied SI and HI, contracts for safety.  Denied A/V hallucinations.  Safety maintained with 15 minute checks.

## 2021-03-28 NOTE — Group Note (Signed)
LCSW Group Therapy Note   Group Date: 03/28/2021 Start Time: 1300 End Time: 1400   Type of Therapy and Topic:  Group Therapy: Reframing Thoughts  Due to acuity on the unit, Social work met with patients individually and provided time for patients to ask questions.  Patient provided with packet and education about the [facility]   Murat Rideout, LCSW, LCAS Clincal Social Worker  [facility]  

## 2021-03-28 NOTE — Progress Notes (Signed)
[facility] MD Progress Note  03/28/2021 10:36 AM [name]  MRN:  [id number] Subjective:  : William [name] is a 20 y.o. male with a past history of anxiety, hypertension and migraines that presented with EMS for suicidal and homicidal ideation over the week prior to admission.  Chart Review, 24 hr Events: The patient's chart was reviewed and nursing notes were reviewed. The patient's case was discussed in multidisciplinary team meeting.  Per MAR: - Patient is compliant with scheduled meds. - PRNs: tylenol x1, hydroxyzine 25 mg x2 50 mg x1, nicotine gum Per RN notes, no documented behavioral issues and is attending group. Patient slept, 4.75 hours  Today's Interview Patient seen and staffed with attending Dr. Lucianne Muss.  Patient states he is sleeping better.  Patient stated he was in a better mood and that he feels that he can be more active during the day. Patient stated that although he slept well he woke up feeling very tachycardia and anxious.  Patient states that this has happened before in the past stating" I have been an anxious kid and have been anxious most of my life.".  Patient stated that the hydroxyzine as needed was very helpful for patient as it helped him calm down. Patient otherwise had no physical complaint.  Patient did inquire regarding discharge planning and expressed interest in an IOP program as a smoother transition from inpatient.  Patient today working on staying positive and maintaining a positive mood.  Patient is also agreeable to attending more groups as he had found those were helpful yesterday.  Patient denies present SI/HI/AVH.    Principal Problem: Acute psychosis (HCC) Diagnosis: Principal Problem:   Acute psychosis (HCC) Active Problems:   MDD (major depressive disorder), recurrent severe, without psychosis (HCC)   Marijuana use  Total Time spent with patient:  I personally spent 35 minutes on the unit in direct patient care. The direct patient care time  included face-to-face time with the patient, reviewing the patient's chart, communicating with other professionals, and coordinating care. Greater than 50% of this time was spent in counseling or coordinating care with the patient regarding goals of hospitalization, psycho-education, and discharge planning needs.   Past Psychiatric History: none  Past Medical History:  Past Medical History:  Diagnosis Date   Anxiety    Hypertension    Migraine    History reviewed. No pertinent surgical history. Family History: History reviewed. No pertinent family history. Family Psychiatric  History: n/a Social History:  Social History   Substance and Sexual Activity  Alcohol Use Never     Social History   Substance and Sexual Activity  Drug Use Never    Social History   Socioeconomic History   Marital status: Single    Spouse name: Not on file   Number of children: Not on file   Years of education: Not on file   Highest education level: Not on file  Occupational History   Not on file  Tobacco Use   Smoking status: Never   Smokeless tobacco: Never  Vaping Use   Vaping Use: Every day  Substance and Sexual Activity   Alcohol use: Never   Drug use: Never   Sexual activity: Not on file  Other Topics Concern   Not on file  Social History Narrative   Not on file   Social Determinants of Health   Financial Resource Strain: Not on file  Food Insecurity: Not on file  Transportation Needs: Not on file  Physical Activity: Not  on file  Stress: Not on file  Social Connections: Not on file   Additional Social History:                         Sleep: Fair  Appetite:  Fair  Current Medications: Current Facility-Administered Medications  Medication Dose Route Frequency Provider Last Rate Last Admin   acetaminophen (TYLENOL) tablet 650 mg  650 mg Oral Q6H PRN Ajibola, Ene A, NP   650 mg at 03/26/21 2136   buPROPion (WELLBUTRIN XL) 24 hr tablet 150 mg  150 mg Oral Daily  Mariel Craft, MD   150 mg at 03/28/21 0743   hydrOXYzine (ATARAX/VISTARIL) tablet 25 mg  25 mg Oral TID PRN Bobbitt, Ysidro Evert E, NP   25 mg at 03/28/21 0744   hydrOXYzine (ATARAX/VISTARIL) tablet 50 mg  50 mg Oral QHS PRN Mariel Craft, MD   50 mg at 03/27/21 2357   nicotine polacrilex (NICORETTE) gum 2 mg  2 mg Oral PRN Roselle Locus, MD   2 mg at 03/28/21 1007   risperiDONE (RISPERDAL) tablet 3 mg  3 mg Oral QHS Mariel Craft, MD   3 mg at 03/27/21 2055    Lab Results:  Results for orders placed or performed during the hospital encounter of 03/25/21 (from the past 48 hour(s))  Hemoglobin A1c     Status: None   Collection Time: 03/27/21  6:21 PM  Result Value Ref Range   Hgb A1c MFr Bld 5.0 4.8 - 5.6 %    Comment: (NOTE) Pre diabetes:          5.7%-6.4%  Diabetes:              >6.4%  Glycemic control for   <7.0% adults with diabetes    Mean Plasma Glucose 96.8 mg/dL    Comment: Performed at [facility], [address] [place], [place] [address]  TSH     Status: None   Collection Time: 03/27/21  6:21 PM  Result Value Ref Range   TSH 4.310 0.350 - 4.500 uIU/mL    Comment: Performed by a 3rd Generation assay with a functional sensitivity of <=0.01 uIU/mL. Performed at [facility], [address] [place], [place] [address]   Lipid panel     Status: Abnormal   Collection Time: 03/27/21  6:21 PM  Result Value Ref Range   Cholesterol 136 0 - 200 mg/dL   Triglycerides 56 <016 mg/dL   HDL 38 (L) >01 mg/dL   Total CHOL/HDL Ratio 3.6 RATIO   VLDL 11 0 - 40 mg/dL   LDL Cholesterol 87 0 - 99 mg/dL    Comment:        Total Cholesterol/HDL:CHD Risk Coronary Heart Disease Risk Table                     Men   Women  1/2 Average Risk   3.4   3.3  Average Risk       5.0   4.4  2 X Average Risk   9.6   7.1  3 X Average Risk  23.4   11.0        Use the calculated Patient Ratio above and the CHD Risk Table to determine the patient's CHD  Risk.        ATP III CLASSIFICATION (LDL):  <100     mg/dL   Optimal  093-235  mg/dL  Near or Above                    Optimal  130-159  mg/dL   Borderline  366-294  mg/dL   High  >765     mg/dL   Very High Performed at [facility], [address], [place], [place] [address]     Blood Alcohol level:  Lab Results  Component Value Date   [facility] <10 03/24/2021    Physical Findings:  Musculoskeletal: Strength & Muscle Tone: within normal limits Gait & Station: normal Patient leans: N/A  Psychiatric Specialty Exam:  Presentation  General Appearance: Appropriate for Environment; Casual  Eye Contact:Fair  Speech:Normal Rate  Speech Volume:Normal  Handedness:Right   Mood and Affect  Mood:Anxious; Euthymic  Affect:Constricted   Thought Process  Thought Processes:Coherent  Descriptions of Associations:Circumstantial  Orientation:Full (Time, Place and Person)  Thought Content:Perseveration; Rumination  History of Schizophrenia/Schizoaffective disorder:No  Duration of Psychotic Symptoms:No data recorded Hallucinations:Hallucinations: None  Ideas of Reference:None  Suicidal Thoughts:Suicidal Thoughts: No SI Passive Intent and/or Plan: Without Intent; Without Plan; Without Means to Carry Out  Homicidal Thoughts:Homicidal Thoughts: No HI Passive Intent and/or Plan: Without Intent; Without Plan; Without Means to Carry Out   Sensorium  Memory:Immediate Good; Recent Good; Remote Good  Judgment:Fair  Insight:Fair   Executive Functions  Concentration:Fair  Attention Span:Fair  Recall:Fair  Fund of Knowledge:Fair  Language:Fair   Psychomotor Activity  Psychomotor Activity:Psychomotor Activity: Decreased   Assets  Assets:Communication Skills; Desire for Improvement; Physical Health; Resilience; Talents/Skills   Sleep  Sleep:Sleep: Fair Number of Hours of Sleep: 4.5    Physical Exam: Physical Exam Vitals and nursing  note reviewed.  Constitutional:      Appearance: Normal appearance. He is normal weight.  HENT:     Head: Normocephalic and atraumatic.  Pulmonary:     Effort: Pulmonary effort is normal.  Neurological:     General: No focal deficit present.     Mental Status: He is oriented to person, place, and time.   Review of Systems  Respiratory:  Negative for shortness of breath.   Cardiovascular:  Negative for chest pain.  Gastrointestinal:  Negative for abdominal pain, constipation, diarrhea, heartburn, nausea and vomiting.  Neurological:  Negative for headaches.  Blood pressure 126/85, pulse (!) 141, temperature 98.6 F (37 C), temperature source Oral, resp. rate 18, height 6' (1.829 m), weight 83.9 kg, SpO2 100 %. Body mass index is 25.09 kg/m.   Treatment Plan Summary: Daily contact with patient to assess and evaluate symptoms and progress in treatment and Medication management  ASSESSMENT William [name] is a 20 y.o. male with a past history of anxiety, hypertension and migraines that presented with EMS for suicidal and homicidal ideation over the week prior to admission.  Patient appeared to be doing better today.  Patient states that the Wellbutrin was very helpful and has "mellowed him out".  Patient interested in IOP and outpatient psychiatry/therapy once discharged.  PLAN Psychiatric Problems MDD, severe w/ psychosis -Wellbutrin XL 150 mg qd for depression -Risperdal 3 mg qhs for auditory hallucination and rumination/perseveration   Medical Problems -TSH, A1c, lipid panel wnl  PRNs Tylenol 650 mg for moderate pain Hydroxyzine 25 mg tid prn for anxiety Hydroxyzine 50 mg qhs prn for sleep Nicorette gum 2 mg for smoking cessation  3. Safety and Monitoring: Voluntary admission to inpatient psychiatric unit for safety, stabilization and treatment Daily contact with patient to assess and evaluate symptoms and progress  in treatment Patient's case to be discussed in  multi-disciplinary team meeting Observation Level : q15 minute checks Vital signs: q12 hours Precautions: suicide, elopement, and assault   4. Discharge Planning: Social work and case management to assist with discharge planning and identification of hospital follow-up needs prior to discharge Estimated LOS: 5-7 days Discharge Concerns: Need to establish a safety plan; Medication compliance and effectiveness Discharge Goals: Return home with outpatient referrals for mental health follow-up including medication management/psychotherapy   Park Pope, MD 03/28/2021, 10:36 AM

## 2021-03-28 NOTE — Progress Notes (Signed)
Pt visible on the unit some this evening, pt stated he was doing better    03/28/21 2200  Psych Admission Type (Psych Patients Only)  Admission Status Voluntary  Psychosocial Assessment  Patient Complaints Anxiety;Depression  Eye Contact Brief  Facial Expression Animated;Anxious  Affect Appropriate to circumstance  Speech Logical/coherent  Interaction Assertive  Motor Activity Other (Comment) (WDL)  Appearance/Hygiene In scrubs  Behavior Characteristics Anxious  Mood Anxious  Aggressive Behavior  Effect No apparent injury  Thought Process  Coherency WDL  Content WDL  Delusions None reported or observed  Perception WDL  Hallucination None reported or observed  Judgment WDL  Confusion None  Danger to Self  Current suicidal ideation? Denies  Self-Injurious Behavior No self-injurious ideation or behavior indicators observed or expressed   Agreement Not to Harm Self Yes  Description of Agreement Verbal contract  Danger to Others  Danger to Others None reported or observed

## 2021-03-28 NOTE — [facility] Group Notes (Signed)
Pt did not attend the AA meeting this evening. Pt was in their room. 

## 2021-03-28 NOTE — BH IP Treatment Plan (Signed)
Interdisciplinary Treatment and Diagnostic Plan Update  03/28/2021 Time of Session: 10:40am William [name] MRN: [id number]  Principal Diagnosis: Acute psychosis [facility])  Secondary Diagnoses: Principal Problem:   Acute psychosis (HCC) Active Problems:   MDD (major depressive disorder), recurrent severe, without psychosis (HCC)   Marijuana use   Current Medications:  Current Facility-Administered Medications  Medication Dose Route Frequency Provider Last Rate Last Admin   acetaminophen (TYLENOL) tablet 650 mg  650 mg Oral Q6H PRN Ajibola, Ene A, NP   650 mg at 03/26/21 2136   buPROPion (WELLBUTRIN XL) 24 hr tablet 150 mg  150 mg Oral Daily Mariel Craft, MD   150 mg at 03/28/21 0743   hydrOXYzine (ATARAX/VISTARIL) tablet 25 mg  25 mg Oral TID PRN Bobbitt, Ysidro Evert E, NP   25 mg at 03/28/21 0744   hydrOXYzine (ATARAX/VISTARIL) tablet 50 mg  50 mg Oral QHS PRN Mariel Craft, MD   50 mg at 03/27/21 2357   nicotine polacrilex (NICORETTE) gum 2 mg  2 mg Oral PRN Roselle Locus, MD   2 mg at 03/28/21 1007   risperiDONE (RISPERDAL) tablet 3 mg  3 mg Oral QHS Mariel Craft, MD   3 mg at 03/27/21 2055   PTA Medications: No medications prior to admission.    Patient Stressors: Financial difficulties   Health problems   Medication change or noncompliance   Occupational concerns    Patient Strengths: Ability for insight  Printmaker for treatment/growth  Supportive family/friends   Treatment Modalities: Medication Management, Group therapy, Case management,  1 to 1 session with clinician, Psychoeducation, Recreational therapy.   Physician Treatment Plan for Primary Diagnosis: Acute psychosis (HCC) Long Term Goal(s): Improvement in symptoms so as ready for discharge   Short Term Goals: Ability to identify changes in lifestyle to reduce recurrence of condition will improve Ability to verbalize feelings will improve Ability to disclose and discuss  suicidal ideas Ability to demonstrate self-control will improve Ability to identify and develop effective coping behaviors will improve Ability to maintain clinical measurements within normal limits will improve Ability to identify triggers associated with substance abuse/mental health issues will improve  Medication Management: Evaluate patient's response, side effects, and tolerance of medication regimen.  Therapeutic Interventions: 1 to 1 sessions, Unit Group sessions and Medication administration.  Evaluation of Outcomes: Progressing  Physician Treatment Plan for Secondary Diagnosis: Principal Problem:   Acute psychosis (HCC) Active Problems:   MDD (major depressive disorder), recurrent severe, without psychosis (HCC)   Marijuana use  Long Term Goal(s): Improvement in symptoms so as ready for discharge   Short Term Goals: Ability to identify changes in lifestyle to reduce recurrence of condition will improve Ability to verbalize feelings will improve Ability to disclose and discuss suicidal ideas Ability to demonstrate self-control will improve Ability to identify and develop effective coping behaviors will improve Ability to maintain clinical measurements within normal limits will improve Ability to identify triggers associated with substance abuse/mental health issues will improve     Medication Management: Evaluate patient's response, side effects, and tolerance of medication regimen.  Therapeutic Interventions: 1 to 1 sessions, Unit Group sessions and Medication administration.  Evaluation of Outcomes: Progressing   RN Treatment Plan for Primary Diagnosis: Acute psychosis (HCC) Long Term Goal(s): Knowledge of disease and therapeutic regimen to maintain health will improve  Short Term Goals: Ability to remain free from injury will improve, Ability to verbalize frustration and anger appropriately will improve, Ability to demonstrate self-control,  Ability to identify and  develop effective coping behaviors will improve, and Compliance with prescribed medications will improve  Medication Management: RN will administer medications as ordered by provider, will assess and evaluate patient's response and provide education to patient for prescribed medication. RN will report any adverse and/or side effects to prescribing provider.  Therapeutic Interventions: 1 on 1 counseling sessions, Psychoeducation, Medication administration, Evaluate responses to treatment, Monitor vital signs and CBGs as ordered, Perform/monitor CIWA, COWS, AIMS and Fall Risk screenings as ordered, Perform wound care treatments as ordered.  Evaluation of Outcomes: Progressing   LCSW Treatment Plan for Primary Diagnosis: Acute psychosis (HCC) Long Term Goal(s): Safe transition to appropriate next level of care at discharge, Engage patient in therapeutic group addressing interpersonal concerns.  Short Term Goals: Engage patient in aftercare planning with referrals and resources, Increase social support, Increase ability to appropriately verbalize feelings, and Identify triggers associated with mental health/substance abuse issues  Therapeutic Interventions: Assess for all discharge needs, 1 to 1 time with Social worker, Explore available resources and support systems, Assess for adequacy in community support network, Educate family and significant other(s) on suicide prevention, Complete Psychosocial Assessment, Interpersonal group therapy.  Evaluation of Outcomes: Progressing   Progress in Treatment: Attending groups: Yes. Participating in groups: Yes. Taking medication as prescribed: Yes. Toleration medication: Yes. Family/Significant other contact made: No, will contact:  father Patient understands diagnosis: Yes. Discussing patient identified problems/goals with staff: Yes. Medical problems stabilized or resolved: Yes. Denies suicidal/homicidal ideation: Yes. Issues/concerns per patient  self-inventory: No.  New problem(s) identified: No, Describe:  none  New Short Term/Long Term Goal(s): medication stabilization, elimination of SI thoughts, development of comprehensive mental wellness plan.    Patient Goals: "Change my thoughts to have a more positive mindset"  Discharge Plan or Barriers: Patient recently admitted. CSW will continue to follow and assess for appropriate referrals and possible discharge planning.    Reason for Continuation of Hospitalization: Anxiety Depression Medication stabilization  Estimated Length of Stay: 3-5 days   Scribe for Treatment Team: Otelia Santee, LCSW 03/28/2021 12:06 PM

## 2021-03-28 NOTE — Progress Notes (Signed)
   03/28/21 0004  Psych Admission Type (Psych Patients Only)  Admission Status Voluntary  Psychosocial Assessment  Patient Complaints Anxiety  Eye Contact Brief  Facial Expression Animated;Anxious  Affect Appropriate to circumstance  Speech Logical/coherent  Interaction Assertive  Motor Activity Other (Comment) (WDL)  Appearance/Hygiene In scrubs  Behavior Characteristics Cooperative  Mood Anxious  Aggressive Behavior  Effect No apparent injury  Thought Process  Coherency WDL  Content WDL  Delusions None reported or observed  Perception Hallucinations  Hallucination Auditory  Judgment WDL  Confusion None  Danger to Self  Current suicidal ideation? Denies  Self-Injurious Behavior No self-injurious ideation or behavior indicators observed or expressed   Agreement Not to Harm Self Yes  Description of Agreement Verbal contract  Danger to Others  Danger to Others None reported or observed

## 2021-03-29 MED ORDER — TRAZODONE HCL 50 MG PO TABS: 50.0000 mg | ORAL_TABLET | Freq: Every evening | ORAL | Status: DC | PRN

## 2021-03-29 MED ORDER — TRAZODONE HCL 50 MG PO TABS
50.0000 mg | ORAL_TABLET | Freq: Once | ORAL | Status: AC
  Administered 2021-03-29: 50 mg via ORAL
  Filled 2021-03-29 (×2): qty 1

## 2021-03-29 MED ORDER — FLUOXETINE HCL 20 MG PO CAPS
20.0000 mg | ORAL_CAPSULE | Freq: Every day | ORAL | Status: DC
  Filled 2021-03-29 (×2): qty 1

## 2021-03-29 MED ORDER — FLUOXETINE HCL 20 MG PO CAPS: 20.0000 mg | ORAL_CAPSULE | Freq: Every day | ORAL | Status: DC

## 2021-03-29 MED ORDER — FLUOXETINE HCL 20 MG PO CAPS
20.0000 mg | ORAL_CAPSULE | Freq: Every day | ORAL | Status: DC
  Administered 2021-03-29 – 2021-03-30 (×2): 20 mg via ORAL
  Filled 2021-03-29 (×3): qty 1

## 2021-03-29 MED ORDER — TRAZODONE HCL 50 MG PO TABS
25.0000 mg | ORAL_TABLET | Freq: Every evening | ORAL | Status: DC | PRN
  Administered 2021-03-29 (×2): 25 mg via ORAL
  Filled 2021-03-29: qty 1

## 2021-03-29 NOTE — [facility] Group Notes (Signed)
Pt attended group and participated in discussion. 

## 2021-03-29 NOTE — Progress Notes (Addendum)
[facility] MD Progress Note  03/29/2021 1:39 PM KIEV LABROSSE  MRN:  [id number] Subjective:  : William [name] is a 20 y.o. male with a past history of anxiety, hypertension and migraines that presented with EMS for suicidal and homicidal ideation over the week prior to admission.  Chart Review, 24 hr Events: The patient's chart was reviewed and nursing notes were reviewed. The patient's case was discussed in multidisciplinary team meeting.  Per MAR: - Patient is compliant with scheduled meds. - PRNs: tylenol x1, hydroxyzine 25 mg x2 50 mg x1, nicotine gum Per RN notes, no documented behavioral issues and is attending group. Patient slept, 5.5 hours  Today's Interview Patient seen and staffed with attending Dr. Viviano Simas.  Patient states he had problems sleeping last night due to insomnia and restlessness.  Patient states that he is been extremely anxious when he wakes up as well as having difficulty falling asleep.  Patient states that last night which she he received trazodone for sleeping and that greatly helped.  Patient tried hydroxyzine prior to that but that was not sufficient for him to fall asleep.  Patient states that he slept well after the trazodone.  Patient states that he is in a good mood and was requesting discharge planning as he is feeling better and has gained some coping skills that he can use.  Patient states that he feels like his racing thoughts are more under control as well as his depression.  Discussed with patient the possibility that the Risperdal and Wellbutrin are contributing to his restlessness as well as insomnia.  Patient agreeable to discontinuing these and starting Prozac for his depression and anxiety.  Patient states that he will use coping skills to better manage his perseverating and ruminative thoughts.  Patient pleasant and cooperative throughout assessment.  Patient denies present SI/HI/AVH.    Principal Problem: MDD (major depressive disorder), recurrent severe,  without psychosis (HCC) Diagnosis: Principal Problem:   MDD (major depressive disorder), recurrent severe, without psychosis (HCC) Active Problems:   Marijuana use  Total Time spent with patient:  I personally spent 35 minutes on the unit in direct patient care. The direct patient care time included face-to-face time with the patient, reviewing the patient's chart, communicating with other professionals, and coordinating care. Greater than 50% of this time was spent in counseling or coordinating care with the patient regarding goals of hospitalization, psycho-education, and discharge planning needs.   Past Psychiatric History: none  Past Medical History:  Past Medical History:  Diagnosis Date   Anxiety    Hypertension    Migraine    History reviewed. No pertinent surgical history. Family History: History reviewed. No pertinent family history. Family Psychiatric  History: n/a Social History:  Social History   Substance and Sexual Activity  Alcohol Use Never     Social History   Substance and Sexual Activity  Drug Use Never    Social History   Socioeconomic History   Marital status: Single    Spouse name: Not on file   Number of children: Not on file   Years of education: Not on file   Highest education level: Not on file  Occupational History   Not on file  Tobacco Use   Smoking status: Never   Smokeless tobacco: Never  Vaping Use   Vaping Use: Every day  Substance and Sexual Activity   Alcohol use: Never   Drug use: Never   Sexual activity: Not on file  Other Topics Concern  Not on file  Social History Narrative   Not on file   Social Determinants of Health   Financial Resource Strain: Not on file  Food Insecurity: Not on file  Transportation Needs: Not on file  Physical Activity: Not on file  Stress: Not on file  Social Connections: Not on file   Additional Social History:                         Sleep: Fair  Appetite:  Fair  Current  Medications: Current Facility-Administered Medications  Medication Dose Route Frequency Provider Last Rate Last Admin   acetaminophen (TYLENOL) tablet 650 mg  650 mg Oral Q6H PRN Ajibola, Ene A, NP   650 mg at 03/26/21 2136   [START ON 03/30/2021] FLUoxetine (PROZAC) capsule 20 mg  20 mg Oral Daily Park Pope, MD       hydrOXYzine (ATARAX/VISTARIL) tablet 25 mg  25 mg Oral TID PRN Bobbitt, Shalon E, NP   25 mg at 03/29/21 5573   nicotine polacrilex (NICORETTE) gum 2 mg  2 mg Oral PRN Roselle Locus, MD   2 mg at 03/29/21 1240   traZODone (DESYREL) tablet 50 mg  50 mg Oral QHS PRN Park Pope, MD        Lab Results:  Results for orders placed or performed during the hospital encounter of 03/25/21 (from the past 48 hour(s))  Hemoglobin A1c     Status: None   Collection Time: 03/27/21  6:21 PM  Result Value Ref Range   Hgb A1c MFr Bld 5.0 4.8 - 5.6 %    Comment: (NOTE) Pre diabetes:          5.7%-6.4%  Diabetes:              >6.4%  Glycemic control for   <7.0% adults with diabetes    Mean Plasma Glucose 96.8 mg/dL    Comment: Performed at [facility], [address] [place], [place] [address]  TSH     Status: None   Collection Time: 03/27/21  6:21 PM  Result Value Ref Range   TSH 4.310 0.350 - 4.500 uIU/mL    Comment: Performed by a 3rd Generation assay with a functional sensitivity of <=0.01 uIU/mL. Performed at [facility], [address] [place], [place] [address]   Lipid panel     Status: Abnormal   Collection Time: 03/27/21  6:21 PM  Result Value Ref Range   Cholesterol 136 0 - 200 mg/dL   Triglycerides 56 <237 mg/dL   HDL 38 (L) >62 mg/dL   Total CHOL/HDL Ratio 3.6 RATIO   VLDL 11 0 - 40 mg/dL   LDL Cholesterol 87 0 - 99 mg/dL    Comment:        Total Cholesterol/HDL:CHD Risk Coronary Heart Disease Risk Table                     Men   Women  1/2 Average Risk   3.4   3.3  Average Risk       5.0   4.4  2 X Average Risk   9.6   7.1  3  X Average Risk  23.4   11.0        Use the calculated Patient Ratio above and the CHD Risk Table to determine the patient's CHD Risk.        ATP III CLASSIFICATION (LDL):  <100  mg/dL   Optimal  993-716  mg/dL   Near or Above                    Optimal  130-159  mg/dL   Borderline  967-893  mg/dL   High  >810     mg/dL   Very High Performed at [facility], [address], [place], [place] [address]     Blood Alcohol level:  Lab Results  Component Value Date   [facility] <10 03/24/2021    Physical Findings:  Musculoskeletal: Strength & Muscle Tone: within normal limits Gait & Station: normal Patient leans: N/A  Psychiatric Specialty Exam:  Presentation  General Appearance: Appropriate for Environment; Casual  Eye Contact:Fair  Speech:Normal Rate  Speech Volume:Normal  Handedness:Right   Mood and Affect  Mood:Anxious; Euthymic  Affect:Constricted   Thought Process  Thought Processes:Coherent  Descriptions of Associations:Circumstantial  Orientation:Full (Time, Place and Person)  Thought Content:Perseveration; Rumination  History of Schizophrenia/Schizoaffective disorder:No  Duration of Psychotic Symptoms:No data recorded Hallucinations:Hallucinations: None  Ideas of Reference:None  Suicidal Thoughts:Suicidal Thoughts: No  Homicidal Thoughts:Homicidal Thoughts: No   Sensorium  Memory:Immediate Good; Recent Good; Remote Good  Judgment:Fair  Insight:Fair   Executive Functions  Concentration:Fair  Attention Span:Fair  Recall:Fair  Fund of Knowledge:Fair  Language:Fair   Psychomotor Activity  Psychomotor Activity:Psychomotor Activity: Decreased   Assets  Assets:Communication Skills; Desire for Improvement; Physical Health; Resilience; Talents/Skills   Sleep  Sleep:Sleep: Fair Number of Hours of Sleep: 4.5    Physical Exam: Physical Exam Vitals and nursing note reviewed.  Constitutional:       Appearance: Normal appearance. He is normal weight.  HENT:     Head: Normocephalic and atraumatic.  Pulmonary:     Effort: Pulmonary effort is normal.  Neurological:     General: No focal deficit present.     Mental Status: He is oriented to person, place, and time.   Review of Systems  Respiratory:  Negative for shortness of breath.   Cardiovascular:  Negative for chest pain.  Gastrointestinal:  Negative for abdominal pain, constipation, diarrhea, heartburn, nausea and vomiting.  Neurological:  Negative for headaches.  Blood pressure 127/69, pulse (!) 112, temperature 98 F (36.7 C), resp. rate 18, height 6' (1.829 m), weight 83.9 kg, SpO2 98 %. Body mass index is 25.09 kg/m.   Treatment Plan Summary: Daily contact with patient to assess and evaluate symptoms and progress in treatment and Medication management  ASSESSMENT OSEIAS HORSEY is a 20 y.o. male with a past history of anxiety, hypertension and migraines that presented with EMS for suicidal and homicidal ideation over the week prior to admission.  Patient appeared to be doing better today.  Patient was somewhat tired due to the restlessness he was experiencing yesterday but is otherwise in a better mood.  Patient interested in discharge planning as well as thought possibly IOP referral.  Patient agreeable to switching from Risperdal/Wellbutrin to Prozac for his depression.  PLAN Psychiatric Problems MDD, severe w/ psychosis -Discontinued Wellbutrin XL 150 mg qd for depression -Discontinued Risperdal 3 mg qhs for auditory hallucination and rumination/perseveration -Start Prozac 20 mg  Insomnia -Trazodone 25 mg prn qhs and repeat x1 prn for sleep  Medical Problems -TSH, A1c, lipid panel wnl  PRNs Tylenol 650 mg for moderate pain Hydroxyzine 25 mg tid prn for anxiety Hydroxyzine 50 mg qhs prn for sleep Nicorette gum 2 mg for smoking cessation  3. Safety and Monitoring: Voluntary admission to inpatient  psychiatric  unit for safety, stabilization and treatment Daily contact with patient to assess and evaluate symptoms and progress in treatment Patient's case to be discussed in multi-disciplinary team meeting Observation Level : q15 minute checks Vital signs: q12 hours Precautions: suicide, elopement, and assault   4. Discharge Planning: Social work and case management to assist with discharge planning and identification of hospital follow-up needs prior to discharge Estimated LOS: 5-7 days Discharge Concerns: Need to establish a safety plan; Medication compliance and effectiveness Discharge Goals: Return home with outpatient referrals for mental health follow-up including medication management/psychotherapy   Park Pope, MD 03/29/2021, 1:39 PM

## 2021-03-29 NOTE — Group Note (Signed)
Recreation Therapy Group Note   Group Topic:Animal Assisted Therapy   Group Date: 03/29/2021 Start Time: 1430 End Time: 1515 Facilitators: Caroll Rancher, LRT/CTRS Location: [address] Dayroom  Animal-Assisted Activity (AAA) Program Checklist/Progress Notes Patient Eligibility Criteria Checklist & Daily Group note for Rec Tx Intervention  AAA/T Program Assumption of Risk Form signed by Patient/ or Parent Legal Guardian Yes  Patient is free of allergies or severe asthma Yes  Patient reports no fear of animals Yes  Patient reports no history of cruelty to animals Yes  Patient understands his/her participation is voluntary Yes  Patient washes hands before animal contact Yes  Patient washes hands after animal contact Yes   Affect/Mood: Appropriate   Participation Level: Engaged   Participation Quality: Independent   Behavior: Appropriate   Speech/Thought Process: Focused   Insight: Good   Judgement: Good   Modes of Intervention: Pet Therapy   Patient Response to Interventions:  Engaged   Education Outcome:  Acknowledges education and In group clarification offered    Clinical Observations/Individualized Feedback: Pt engaged with therapy dog and peers appropriately.  Pt asked appropriate questions.  Pt was engaged throughout activity.  Pt shared stories about their pets at home.    Plan: Continue to engage patient in RT group sessions 2-3x/week.   Caroll Rancher, LRT/CTRS 03/29/2021 4:08 PM

## 2021-03-29 NOTE — Progress Notes (Signed)
Patient rated his day as an 8 out of 10 since he enjoyed the group activities. His goal for tomorrow is to have another good day.

## 2021-03-29 NOTE — Progress Notes (Signed)
Pt continues to endorse increased anxiety level evidenced by restlessness / fidgety on interactions and tachycardia with manual radial pulse 112 bpm. Rates his anxiety level 7/10 "I don't know why I'm always anxious and I can't get myself to calm down". Reported problems staying asleep last night "I was hearing noises from outside, I had racing thoughts and I just couldn't sleep. Reported Trazodone 50 mg PO given at 0112 X 1 was effective in helping him fall asleep later in the morning. Pt's goal for today is "To come up with a discharge plan and what I'll do when I'm out of here". Support, encouragement and reassurance offered to pt this shift. All medications administered with verbal education and effects monitored. Q 15 minutes safety checks maintained without outburst.  Pt tolerates all PO intake and medications well. Denies discomfort at this time. Remains safe on and off unit.

## 2021-03-29 NOTE — Progress Notes (Signed)
Pt continues to be restless requesting something to help him sleep, NP-Cody ordered 1 x 50 mg Trazodone

## 2021-03-29 NOTE — Progress Notes (Signed)
Pt stated he was anxious but it was getting better. Pt given PRN Trazodone and Vistaril per MAR with gum    03/29/21 2000  Psych Admission Type (Psych Patients Only)  Admission Status Voluntary  Psychosocial Assessment  Patient Complaints Anxiety;Restlessness  Eye Contact Fair  Facial Expression Animated;Anxious  Affect Appropriate to circumstance  Speech Logical/coherent  Interaction Assertive  Motor Activity Restless;Fidgety  Appearance/Hygiene In scrubs  Behavior Characteristics Cooperative  Mood Anxious  Aggressive Behavior  Effect No apparent injury  Thought Process  Coherency WDL  Content WDL  Delusions None reported or observed  Perception WDL  Hallucination None reported or observed  Judgment WDL  Confusion None  Danger to Self  Current suicidal ideation? Denies  Self-Injurious Behavior No self-injurious ideation or behavior indicators observed or expressed   Agreement Not to Harm Self Yes  Description of Agreement Verbal contract  Danger to Others  Danger to Others None reported or observed

## 2021-03-29 NOTE — BHH Group Notes (Signed)
Adult Psychoeducational Group Note  Date:  03/29/2021 Time:  8:55 AM  Group Topic/Focus:  Goals Group:   The focus of this group is to help patients establish daily goals to achieve during treatment and discuss how the patient can incorporate goal setting into their daily lives to aide in recovery.  Participation Level:  Active   Engagement in Group:  Engaged  Modes of Intervention:  Discussion  Additional Comments:  Pt's goals group was led by the Nursing instructor and students. Pt's goal today was to talk to the MD and come up with a discharge plan with them.   Deforest Hoyles Sennie Borden 03/29/2021, 8:55 AM

## 2021-03-30 ENCOUNTER — Encounter (HOSPITAL_COMMUNITY): Payer: Self-pay

## 2021-03-30 MED ORDER — QUETIAPINE FUMARATE ER 50 MG PO TB24
50.0000 mg | ORAL_TABLET | Freq: Every day | ORAL | Status: DC
  Administered 2021-03-30 – 2021-03-31 (×2): 50 mg via ORAL
  Filled 2021-03-30 (×4): qty 1

## 2021-03-30 MED ORDER — FLUOXETINE HCL 20 MG PO CAPS
40.0000 mg | ORAL_CAPSULE | Freq: Every day | ORAL | Status: DC
  Administered 2021-03-31 – 2021-04-01 (×2): 40 mg via ORAL
  Filled 2021-03-30 (×4): qty 2

## 2021-03-30 MED ORDER — FLUOXETINE HCL 20 MG PO CAPS
20.0000 mg | ORAL_CAPSULE | Freq: Every day | ORAL | Status: AC
  Administered 2021-03-30: 20 mg via ORAL
  Filled 2021-03-30 (×2): qty 1

## 2021-03-30 NOTE — BH IP Treatment Plan (Signed)
Interdisciplinary Treatment and Diagnostic Plan Update  03/30/2021 Time of Session:  William [name] MRN: [id number]  Principal Diagnosis: MDD (major depressive disorder), recurrent severe, without psychosis (HCC)  Secondary Diagnoses: Principal Problem:   MDD (major depressive disorder), recurrent severe, without psychosis (HCC) Active Problems:   Marijuana use   Current Medications:  Current Facility-Administered Medications  Medication Dose Route Frequency Provider Last Rate Last Admin   acetaminophen (TYLENOL) tablet 650 mg  650 mg Oral Q6H PRN Ajibola, Ene A, NP   650 mg at 03/26/21 2136   FLUoxetine (PROZAC) capsule 20 mg  20 mg Oral Daily Park Pope, MD   20 mg at 03/30/21 0800   hydrOXYzine (ATARAX/VISTARIL) tablet 25 mg  25 mg Oral TID PRN Bobbitt, Ysidro Evert E, NP   25 mg at 03/29/21 2138   nicotine polacrilex (NICORETTE) gum 2 mg  2 mg Oral PRN Roselle Locus, MD   2 mg at 03/30/21 0803   traZODone (DESYREL) tablet 25 mg  25 mg Oral QHS PRN,MR X 1 Park Pope, MD   25 mg at 03/29/21 2244   PTA Medications: No medications prior to admission.    Patient Stressors: Financial difficulties   Health problems   Medication change or noncompliance   Occupational concerns    Patient Strengths: Ability for insight  Printmaker for treatment/growth  Supportive family/friends   Treatment Modalities: Medication Management, Group therapy, Case management,  1 to 1 session with clinician, Psychoeducation, Recreational therapy.   Physician Treatment Plan for Primary Diagnosis: MDD (major depressive disorder), recurrent severe, without psychosis (HCC) Long Term Goal(s): Improvement in symptoms so as ready for discharge   Short Term Goals: Ability to identify changes in lifestyle to reduce recurrence of condition will improve Ability to verbalize feelings will improve Ability to disclose and discuss suicidal ideas Ability to demonstrate self-control will  improve Ability to identify and develop effective coping behaviors will improve Ability to maintain clinical measurements within normal limits will improve Ability to identify triggers associated with substance abuse/mental health issues will improve  Medication Management: Evaluate patient's response, side effects, and tolerance of medication regimen.  Therapeutic Interventions: 1 to 1 sessions, Unit Group sessions and Medication administration.  Evaluation of Outcomes: Progressing  Physician Treatment Plan for Secondary Diagnosis: Principal Problem:   MDD (major depressive disorder), recurrent severe, without psychosis (HCC) Active Problems:   Marijuana use  Long Term Goal(s): Improvement in symptoms so as ready for discharge   Short Term Goals: Ability to identify changes in lifestyle to reduce recurrence of condition will improve Ability to verbalize feelings will improve Ability to disclose and discuss suicidal ideas Ability to demonstrate self-control will improve Ability to identify and develop effective coping behaviors will improve Ability to maintain clinical measurements within normal limits will improve Ability to identify triggers associated with substance abuse/mental health issues will improve     Medication Management: Evaluate patient's response, side effects, and tolerance of medication regimen.  Therapeutic Interventions: 1 to 1 sessions, Unit Group sessions and Medication administration.  Evaluation of Outcomes: Progressing   RN Treatment Plan for Primary Diagnosis: MDD (major depressive disorder), recurrent severe, without psychosis (HCC) Long Term Goal(s): Knowledge of disease and therapeutic regimen to maintain health will improve  Short Term Goals: Ability to participate in decision making will improve, Ability to verbalize feelings will improve, and Ability to identify and develop effective coping behaviors will improve  Medication Management: RN will  administer medications as ordered by provider, will assess  and evaluate patient's response and provide education to patient for prescribed medication. RN will report any adverse and/or side effects to prescribing provider.  Therapeutic Interventions: 1 on 1 counseling sessions, Psychoeducation, Medication administration, Evaluate responses to treatment, Monitor vital signs and CBGs as ordered, Perform/monitor CIWA, COWS, AIMS and Fall Risk screenings as ordered, Perform wound care treatments as ordered.  Evaluation of Outcomes: Progressing   LCSW Treatment Plan for Primary Diagnosis: MDD (major depressive disorder), recurrent severe, without psychosis (HCC) Long Term Goal(s): Safe transition to appropriate next level of care at discharge, Engage patient in therapeutic group addressing interpersonal concerns.  Short Term Goals: Engage patient in aftercare planning with referrals and resources, Increase social support, and Increase ability to appropriately verbalize feelings  Therapeutic Interventions: Assess for all discharge needs, 1 to 1 time with Social worker, Explore available resources and support systems, Assess for adequacy in community support network, Educate family and significant other(s) on suicide prevention, Complete Psychosocial Assessment, Interpersonal group therapy.  Evaluation of Outcomes: Progressing   Progress in Treatment: Attending groups: Yes. Participating in groups: Yes. Taking medication as prescribed: Yes. Toleration medication: Yes. Family/Significant other contact made: No, will contact:  father Patient understands diagnosis: Yes. Discussing patient identified problems/goals with staff: Yes. Medical problems stabilized or resolved: Yes. Denies suicidal/homicidal ideation: Yes. Issues/concerns per patient self-inventory: No. Other: None  New problem(s) identified: No, Describe:  None  New Short Term/Long Term Goal(s):medication stabilization, elimination  of SI thoughts, development of comprehensive mental wellness plan.   Patient Goals:  Change my thoughts to have a more positive mindset  Discharge Plan or Barriers: Pt will attend PHP post discharge.  Reason for Continuation of Hospitalization: Medication stabilization  Estimated Length of Stay:   Scribe for Treatment Team: Chrys Racer 03/30/2021 9:51 AM

## 2021-03-30 NOTE — Progress Notes (Signed)
Progress note    03/30/21 0803  Psych Admission Type (Psych Patients Only)  Admission Status Voluntary  Psychosocial Assessment  Patient Complaints Anxiety;Depression  Eye Contact Fair  Facial Expression Anxious  Affect Anxious;Depressed;Sad  Speech Logical/coherent  Interaction Assertive  Motor Activity Slow  Appearance/Hygiene Unremarkable  Behavior Characteristics Cooperative;Appropriate to situation;Anxious  Mood Depressed;Anxious;Sad;Pleasant  Thought Process  Coherency WDL  Content WDL  Delusions WDL  Perception WDL  Hallucination None reported or observed  Judgment Poor  Confusion None  Danger to Self  Current suicidal ideation? Denies  Self-Injurious Behavior No self-injurious ideation or behavior indicators observed or expressed   Danger to Others  Danger to Others None reported or observed

## 2021-03-30 NOTE — Group Note (Signed)
Recreation Therapy Group Note   Group Topic:Leisure Education  Group Date: 03/30/2021 Start Time: 1000 End Time: 1050 Facilitators: Caroll Rancher, LRT/CTRS Location: [address] Dayroom  Goal Area(s) Addresses:  Patient will identify positive leisure activities for use post discharge. Patient will identify at least one positive benefit of participation in leisure activities.   Group Description: Patients will be divided into 2 teams.  Each team will take turns pulling a slip of paper from the can with an activity on it.  Members of the teams will take turns drawing what is on the paper.  Each team gets one minute to draw and guess what the picture is.  If the team gets the correct answer, they receive a point.  If the team doesn't get the answer, the other team can steal the point.  The team with the most points wins the game.   Affect/Mood: Appropriate   Participation Level: Active and Engaged   Participation Quality: Independent   Behavior: Appropriate and Attentive    Speech/Thought Process: Focused   Insight: Good   Judgement: Good   Modes of Intervention: Competitive Play   Patient Response to Interventions:  Attentive and Engaged   Education Outcome:  Acknowledges education and In group clarification offered    Clinical Observations/Individualized Feedback: Pt came in late to group.  One of patients peers explained the activity to pt when he arrived to group.  Once pt understood the activity, pt joined right in.  Pt was smiling and really attentive to the game.  Pt expressed having fun at completion of activity.    Plan: Continue to engage patient in RT group sessions 2-3x/week.   Caroll Rancher, LRT/CTRS 03/30/2021 12:27 PM

## 2021-03-30 NOTE — Progress Notes (Addendum)
[facility] MD Progress Note  03/30/2021 5:02 PM William [name]  MRN:  [id number] Subjective:  : William [name] is a 20 y.o. male with a past history of anxiety, hypertension and migraines that presented with EMS for suicidal and homicidal ideation over the week prior to admission.  Chart Review, 24 hr Events: The patient's chart was reviewed and nursing notes were reviewed. The patient's case was discussed in multidisciplinary team meeting.  Per MAR: - Patient is compliant with scheduled meds. - PRNs: hydroxyzine 25 mg x3, nicotine gum, trazodone 25 x2 Per RN notes, no documented behavioral issues and is attending group. Patient slept, 5.75 hours  Today's Interview Patient seen and staffed with attending Dr. Viviano Simas.  Patient states he was feeling much better this morning.  Patient still had not felt restless before he went to bed but was able to fall asleep appropriately once he took his trazodone.  Patient states that he had some mild anxiety throughout the day but he states that he had felt better than he has in a while.  Patient states his present goal is to work on being able to fall asleep appropriately as well as deal with the negative thoughts he has.  Patient states that he has had some coping skills to allow him to move past his negative thoughts.  Patient still endorsing some mild depression as well as ruminative thoughts that he is presently more able to cope with.  Patient is eating and drinking appropriately.  Patient says he has no GI complaints, headache, chest pain, tremors.   Patient denies present SI/HI/AVH.    Principal Problem: MDD (major depressive disorder), recurrent severe, without psychosis (HCC) Diagnosis: Principal Problem:   MDD (major depressive disorder), recurrent severe, without psychosis (HCC) Active Problems:   Marijuana use  Total Time spent with patient:  I personally spent 35 minutes on the unit in direct patient care. The direct patient care time included  face-to-face time with the patient, reviewing the patient's chart, communicating with other professionals, and coordinating care. Greater than 50% of this time was spent in counseling or coordinating care with the patient regarding goals of hospitalization, psycho-education, and discharge planning needs.   Past Psychiatric History: none  Past Medical History:  Past Medical History:  Diagnosis Date   Anxiety    Hypertension    Migraine    History reviewed. No pertinent surgical history. Family History: History reviewed. No pertinent family history. Family Psychiatric  History: n/a Social History:  Social History   Substance and Sexual Activity  Alcohol Use Never     Social History   Substance and Sexual Activity  Drug Use Never    Social History   Socioeconomic History   Marital status: Single    Spouse name: Not on file   Number of children: Not on file   Years of education: Not on file   Highest education level: Not on file  Occupational History   Not on file  Tobacco Use   Smoking status: Never   Smokeless tobacco: Never  Vaping Use   Vaping Use: Every day  Substance and Sexual Activity   Alcohol use: Never   Drug use: Never   Sexual activity: Not on file  Other Topics Concern   Not on file  Social History Narrative   Not on file   Social Determinants of Health   Financial Resource Strain: Not on file  Food Insecurity: Not on file  Transportation Needs: Not on file  Physical Activity:  Not on file  Stress: Not on file  Social Connections: Not on file   Additional Social History:                         Sleep: Fair  Appetite:  Fair  Current Medications: Current Facility-Administered Medications  Medication Dose Route Frequency Provider Last Rate Last Admin   acetaminophen (TYLENOL) tablet 650 mg  650 mg Oral Q6H PRN Ajibola, Ene A, NP   650 mg at 03/26/21 2136   FLUoxetine (PROZAC) capsule 20 mg  20 mg Oral Daily Mariel Craft, MD        [START ON 03/31/2021] FLUoxetine (PROZAC) capsule 40 mg  40 mg Oral Daily Park Pope, MD       hydrOXYzine (ATARAX/VISTARIL) tablet 25 mg  25 mg Oral TID PRN Bobbitt, Shalon E, NP   25 mg at 03/30/21 1442   nicotine polacrilex (NICORETTE) gum 2 mg  2 mg Oral PRN Roselle Locus, MD   2 mg at 03/30/21 1632   QUEtiapine (SEROQUEL XR) 24 hr tablet 50 mg  50 mg Oral Seabron Spates, MD        Lab Results:  No results found for this or any previous visit (from the past 48 hour(s)).   Blood Alcohol level:  Lab Results  Component Value Date   ETH <10 03/24/2021    Physical Findings:  Musculoskeletal: Strength & Muscle Tone: within normal limits Gait & Station: normal Patient leans: N/A  Psychiatric Specialty Exam:  Presentation  General Appearance: Appropriate for Environment; Casual  Eye Contact:Fair  Speech:Slow; Clear and Coherent  Speech Volume:Normal  Handedness:Right   Mood and Affect  Mood:Dysphoric; Anxious  Affect:Depressed   Thought Process  Thought Processes:Coherent  Descriptions of Associations:Intact  Orientation:Full (Time, Place and Person)  Thought Content:Logical  History of Schizophrenia/Schizoaffective disorder:No  Duration of Psychotic Symptoms:No data recorded Hallucinations:Hallucinations: None  Ideas of Reference:None  Suicidal Thoughts:Suicidal Thoughts: No  Homicidal Thoughts:Homicidal Thoughts: No   Sensorium  Memory:Immediate Good; Recent Good; Remote Good  Judgment:Intact  Insight:Fair   Executive Functions  Concentration:Fair  Attention Span:Fair  Recall:Fair  Fund of Knowledge:Fair  Language:Fair   Psychomotor Activity  Psychomotor Activity:Psychomotor Activity: Normal   Assets  Assets:Communication Skills; Desire for Improvement; Physical Health; Resilience; Talents/Skills   Sleep  Sleep:Sleep: Good Number of Hours of Sleep: 5.75    Physical Exam: Physical Exam Vitals and nursing note  reviewed.  Constitutional:      Appearance: Normal appearance. He is normal weight.  HENT:     Head: Normocephalic and atraumatic.  Pulmonary:     Effort: Pulmonary effort is normal.  Neurological:     General: No focal deficit present.     Mental Status: He is oriented to person, place, and time.   Review of Systems  Respiratory:  Negative for shortness of breath.   Cardiovascular:  Negative for chest pain.  Gastrointestinal:  Negative for abdominal pain, constipation, diarrhea, heartburn, nausea and vomiting.  Neurological:  Negative for headaches.  Blood pressure 137/90, pulse 90, temperature 98 F (36.7 C), resp. rate 18, height 6' (1.829 m), weight 83.9 kg, SpO2 97 %. Body mass index is 25.09 kg/m.   Treatment Plan Summary: Daily contact with patient to assess and evaluate symptoms and progress in treatment and Medication management  ASSESSMENT William [name] is a 20 y.o. male with a past history of anxiety, hypertension and migraines that presented with EMS for suicidal and homicidal  ideation over the week prior to admission.  Patient appeared to be doing better today but still appears anxious and endorsing some insomnia. Plan to discontinue trazodone as needed and add Seroquel to patient's regimen for both sedation and continued mood instability and ruminative thought. Patient requesting virtual IOP/PHP if possible.  Likely ready for discharge Friday.  PLAN Psychiatric Problems MDD, severe w/ psychosis -Discontinued Wellbutrin XL 150 mg qd for depression -Discontinued Risperdal 3 mg qhs for auditory hallucination and rumination/perseveration -Prozac 40 mg for depression for anxiety/depression -Seroquel XR 50 mg for continued ruminations and perseveration  Insomnia -Discontinued Trazodone 25 mg prn qhs and repeat x1 prn for sleep -Seroquel XR 50 mg for insomnia  Medical Problems -TSH, A1c, lipid panel wnl  PRNs Tylenol 650 mg for moderate pain Hydroxyzine 25 mg tid  prn for anxiety Hydroxyzine 50 mg qhs prn for sleep Nicorette gum 2 mg for smoking cessation  3. Safety and Monitoring: Voluntary admission to inpatient psychiatric unit for safety, stabilization and treatment Daily contact with patient to assess and evaluate symptoms and progress in treatment Patient's case to be discussed in multi-disciplinary team meeting Observation Level : q15 minute checks Vital signs: q12 hours Precautions: suicide, elopement, and assault   4. Discharge Planning: Social work and case management to assist with discharge planning and identification of hospital follow-up needs prior to discharge Estimated LOS: 5-7 days Discharge Concerns: Need to establish a safety plan; Medication compliance and effectiveness Discharge Goals: Return home with outpatient referrals for mental health follow-up including medication management/psychotherapy   Park Pope, MD 03/30/2021, 5:02 PM

## 2021-03-30 NOTE — Progress Notes (Signed)
Pt visible on the unit this evenig , pt stated he was doing better with his Anxiety.     03/30/21 2100  Psych Admission Type (Psych Patients Only)  Admission Status Voluntary  Psychosocial Assessment  Patient Complaints Anxiety;Depression  Eye Contact Fair  Facial Expression Anxious  Affect Anxious;Depressed;Sad  Speech Logical/coherent  Interaction Assertive  Motor Activity Slow  Appearance/Hygiene Unremarkable  Behavior Characteristics Cooperative  Mood Depressed;Anxious  Thought Process  Coherency WDL  Content WDL  Delusions WDL  Perception WDL  Hallucination None reported or observed  Judgment Poor  Confusion None  Danger to Self  Current suicidal ideation? Denies  Self-Injurious Behavior No self-injurious ideation or behavior indicators observed or expressed   Danger to Others  Danger to Others None reported or observed

## 2021-03-30 NOTE — [facility] Suicide Risk Assessment (Signed)
[facility] INPATIENT:  Family/Significant Other Suicide Prevention Education  Suicide Prevention Education:  Education Completed; father Brewster Wolters [phone],  (name of family member/significant other) has been identified by the patient as the family member/significant other with whom the patient will be residing, and identified as the person(s) who will aid the patient in the event of a mental health crisis (suicidal ideations/suicide attempt).  With written consent from the patient, the family member/significant other has been provided the following suicide prevention education, prior to the and/or following the discharge of the patient.  The suicide prevention education provided includes the following: Suicide risk factors Suicide prevention and interventions National Suicide Hotline telephone number [facility] assessment telephone number [facility] Emergency Assistance 911 [facility] and/or Residential Mobile Crisis Unit telephone number  Request made of family/significant other to: Remove weapons (e.g., guns, rifles, knives), all items previously/currently identified as safety concern.   Remove drugs/medications (over-the-counter, prescriptions, illicit drugs), all items previously/currently identified as a safety concern.  The family member/significant other verbalizes understanding of the suicide prevention education information provided.  The family member/significant other agrees to remove the items of safety concern listed above.  "He has been dealing with depression and anxiety. He lost his job 2 weeks ago and then his car broke down. He lives with me but I am gone to [place] during the week working. He can live with his mom for a few days in [place] if he need to.". CSW discussed pt's f/u. No weapons in home. No safety concerns.   Felizardo Hoffmann 03/30/2021, 11:03 AM

## 2021-03-30 NOTE — Group Note (Signed)
[facility] LCSW Group Therapy Note   Group Date: 03/30/2021 Start Time: 1300 End Time: 1400   Type of Therapy/Topic:  Group Therapy:  Emotion Regulation  Participation Level:  Active    Description of Group:    The purpose of this group is to assist patients in learning to regulate negative emotions and experience positive emotions. Patients will be guided to discuss ways in which they have been vulnerable to their negative emotions. These vulnerabilities will be juxtaposed with experiences of positive emotions or situations, and patients challenged to use positive emotions to combat negative ones. Special emphasis will be placed on coping with negative emotions in conflict situations, and patients will process healthy conflict resolution skills.  Therapeutic Goals: Patient will identify two positive emotions or experiences to reflect on in order to balance out negative emotions:  Patient will label two or more emotions that they find the most difficult to experience:  Patient will be able to demonstrate positive conflict resolution skills through discussion or role plays:   Summary of Patient Progress:  Patient participated appropriately in group.  Patient participated in group activity and worksheet.  Patient offered and provided feedback to the group about group topic.    Therapeutic Modalities:   Cognitive Behavioral Therapy Feelings Identification Dialectical Behavioral Therapy   Doria Fern E Kerianna Rawlinson, LCSW

## 2021-03-30 NOTE — Progress Notes (Signed)
Pt up requesting something else to help him sleep, pt encouraged to lay down and try to get some sleep. Pt was given both doses of 25 mg Trazodone per [facility]

## 2021-03-30 NOTE — [facility] Group Notes (Signed)
Adult Psychoeducational Group Note  Date:  03/30/2021 Time:  10:20 AM  Group Topic/Focus:  Goals Group:   The focus of this group is to help patients establish daily goals to achieve during treatment and discuss how the patient can incorporate goal setting into their daily lives to aide in recovery.  Participation Level:  Did Not Attend  Deforest Hoyles [facility] 03/30/2021, 10:20 AM

## 2021-03-30 NOTE — Plan of Care (Signed)
  Problem: Education: Goal: Knowledge of [place] [facility] Education information/materials will improve Outcome: Progressing Goal: Emotional status will improve Outcome: Progressing Goal: Mental status will improve Outcome: Progressing Goal: Verbalization of understanding the information provided will improve Outcome: Progressing   

## 2021-03-31 MED ORDER — HYDROXYZINE HCL 25 MG PO TABS: 25.0000 mg | ORAL_TABLET | Freq: Three times a day (TID) | ORAL | 0 refills | Status: AC | PRN

## 2021-03-31 MED ORDER — QUETIAPINE FUMARATE ER 50 MG PO TB24: 50.0000 mg | ORAL_TABLET | Freq: Every day | ORAL | 0 refills | Status: DC

## 2021-03-31 MED ORDER — FLUOXETINE HCL 40 MG PO CAPS: 40.0000 mg | ORAL_CAPSULE | Freq: Every day | ORAL | 0 refills | Status: DC

## 2021-03-31 MED ORDER — NICOTINE POLACRILEX 2 MG MT GUM: 2.0000 mg | CHEWING_GUM | OROMUCOSAL | 0 refills | Status: DC | PRN

## 2021-03-31 NOTE — [facility] Group Notes (Signed)
The focus of this group is to help patients establish daily goals to achieve during treatment and discuss how the patient can incorporate goal setting into their daily lives to aide in recovery.  Pt did not attend group 

## 2021-03-31 NOTE — [facility] Group Notes (Signed)
[facility] Group Notes:  (Nursing/MHT/Case Management/Adjunct)  Date:  03/31/2021  Time:  9:07 PM  Type of Therapy:   wrap up  Participation Level:  Minimal  Participation Quality:  Attentive  Affect:  Appropriate  Cognitive:  Appropriate  Insight:  Improving  Engagement in Group:  Developing/Improving  Modes of Intervention:  Discussion  Summary of Progress/Problems: PT was not willing to share but did participate in some of the conversation.   Lorita Officer 03/31/2021, 9:07 PM

## 2021-03-31 NOTE — Progress Notes (Signed)
Pt stated he was doing better, pt stated he was starting to be depressed this evening. Pt given PRN Vistaril per MAR .     03/31/21 2200  Psych Admission Type (Psych Patients Only)  Admission Status Voluntary  Psychosocial Assessment  Patient Complaints Anxiety  Eye Contact Fair  Facial Expression Anxious  Affect Anxious;Depressed;Sad  Speech Logical/coherent  Interaction Assertive  Motor Activity Slow  Appearance/Hygiene Unremarkable  Behavior Characteristics Cooperative  Mood Anxious  Thought Process  Coherency WDL  Content WDL  Delusions WDL  Perception WDL  Hallucination None reported or observed  Judgment Poor  Confusion None  Danger to Self  Current suicidal ideation? Denies  Self-Injurious Behavior No self-injurious ideation or behavior indicators observed or expressed   Danger to Others  Danger to Others None reported or observed

## 2021-03-31 NOTE — Group Note (Signed)
Recreation Therapy Group Note   Group Topic:Communication  Group Date: 03/31/2021 Start Time: 1010 End Time: 1050 Facilitators: Caroll Rancher, LRT/CTRS Location: [address] Dayroom  Goal Area(s) Addresses:  Patient will effectively listen to complete activity.  Patient will identify communication skills used to make activity successful.  Patient will identify how skills used during activity can be used to reach post d/c goals.    Group Description: Geometric Drawings.  Three volunteers from the peer group will be shown an abstract picture with a particular arrangement of geometrical shapes.  Each round, one 'speaker' will describe the pattern, as accurately as possible without revealing the image to the group.  The remaining group members will listen and draw the picture to reflect how it is described to them. Patients with the role of 'listener' cannot ask clarifying questions but, may request that the speaker repeat a direction. Once the drawings are complete, the presenter will show the rest of the group the picture and compare how close each person came to drawing the picture. LRT will facilitate a post-activity discussion regarding effective communication and the importance of planning, listening, and asking for clarification in daily interactions with others.   Affect/Mood: Appropriate   Participation Level: Engaged   Participation Quality: Independent   Behavior: Appropriate and Attentive    Speech/Thought Process: Focused   Insight: Good   Judgement: Good   Modes of Intervention: Activity   Patient Response to Interventions:  Attentive and Engaged   Education Outcome:  Acknowledges education and In group clarification offered    Clinical Observations/Individualized Feedback: Pt came late to group.  Once activity was explained to pt, pt became very engaged in the activity.  Pt asked questions and had good interaction with peers.  Pt was called out of group to meet with  doctor.  Pt came back at the end of the group session.  Plan: Continue to engage patient in RT group sessions 2-3x/week.   Caroll Rancher, LRT/CTRS 03/31/2021 12:14 PM

## 2021-03-31 NOTE — [facility] Group Notes (Signed)
Pt attended evening NA group.  

## 2021-03-31 NOTE — Progress Notes (Addendum)
[facility] MD Progress Note  03/31/2021 10:10 AM William [name]  MRN:  [id number] Subjective:  : William [name] is a 20 y.o. male with a past history of anxiety, hypertension and migraines that presented with EMS for suicidal and homicidal ideation over the week prior to admission.  Chart Review, 24 hr Events: The patient's chart was reviewed and nursing notes were reviewed. The patient's case was discussed in multidisciplinary team meeting.  Per MAR: - Patient is compliant with scheduled meds. - PRNs: hydroxyzine 25 mg x3, nicotine gum, trazodone 25 x2 Per RN notes, no documented behavioral issues and is attending group. Patient slept, 6.5 hours  Today's Interview Patient seen and staffed with attending Dr. Mason Jim.  Patient states he was "feeling pretty good".  Patient states that he is significantly less depressed than since admission and he was able to better deal with his anxiety.  Patient also expresses that he is having less ruminative thought prior to falling asleep.  Patient also endorses that he has slept better than he has had before since he fell asleep prior to 1130.  Patient denies daytime sleepiness.  Patient thinks the hydroxyzine has been very helpful and the dose is appropriate at this time.  Patient is excited for discharge tomorrow and he feels ready for it.  Patient is appreciative of family support as well as healthcare team here.  Patient denies present SI/HI/AVH, paranoia, thought blocking, thought insertion, delusions, first rank symptoms.   Principal Problem: MDD (major depressive disorder), recurrent severe, without psychosis (HCC) Diagnosis: Principal Problem:   MDD (major depressive disorder), recurrent severe, without psychosis (HCC) Active Problems:   Marijuana use  Total Time spent with patient:  I personally spent 35 minutes on the unit in direct patient care. The direct patient care time included face-to-face time with the patient, reviewing the patient's  chart, communicating with other professionals, and coordinating care. Greater than 50% of this time was spent in counseling or coordinating care with the patient regarding goals of hospitalization, psycho-education, and discharge planning needs.   Past Psychiatric History: none  Past Medical History:  Past Medical History:  Diagnosis Date   Anxiety    Hypertension    Migraine    History reviewed. No pertinent surgical history. Family History: History reviewed. No pertinent family history. Family Psychiatric  History: n/a Social History:  Social History   Substance and Sexual Activity  Alcohol Use Never     Social History   Substance and Sexual Activity  Drug Use Never    Social History   Socioeconomic History   Marital status: Single    Spouse name: Not on file   Number of children: Not on file   Years of education: Not on file   Highest education level: Not on file  Occupational History   Not on file  Tobacco Use   Smoking status: Never   Smokeless tobacco: Never  Vaping Use   Vaping Use: Every day  Substance and Sexual Activity   Alcohol use: Never   Drug use: Never   Sexual activity: Not on file  Other Topics Concern   Not on file  Social History Narrative   Not on file   Social Determinants of Health   Financial Resource Strain: Not on file  Food Insecurity: Not on file  Transportation Needs: Not on file  Physical Activity: Not on file  Stress: Not on file  Social Connections: Not on file   Additional Social History:  Sleep: Fair  Appetite:  Fair  Current Medications: Current Facility-Administered Medications  Medication Dose Route Frequency Provider Last Rate Last Admin   acetaminophen (TYLENOL) tablet 650 mg  650 mg Oral Q6H PRN Ajibola, Ene A, NP   650 mg at 03/26/21 2136   FLUoxetine (PROZAC) capsule 40 mg  40 mg Oral Daily Park Pope, MD   40 mg at 03/31/21 6948   hydrOXYzine (ATARAX/VISTARIL) tablet 25 mg   25 mg Oral TID PRN Bobbitt, Ysidro Evert E, NP   25 mg at 03/31/21 5462   nicotine polacrilex (NICORETTE) gum 2 mg  2 mg Oral PRN Roselle Locus, MD   2 mg at 03/31/21 7035   QUEtiapine (SEROQUEL XR) 24 hr tablet 50 mg  50 mg Oral Seabron Spates, MD   50 mg at 03/30/21 2206    Lab Results:  No results found for this or any previous visit (from the past 48 hour(s)).   Blood Alcohol level:  Lab Results  Component Value Date   ETH <10 03/24/2021    Physical Findings:  Musculoskeletal: Strength & Muscle Tone: within normal limits Gait & Station: normal Patient leans: N/A  Psychiatric Specialty Exam:  Presentation  General Appearance: Appropriate for Environment; Casual  Eye Contact:Fair  Speech:Slow; Clear and Coherent  Speech Volume:Normal  Handedness:Right   Mood and Affect  Mood: described as improved - appears more euthymic  Affect:mildly constricted   Thought Process  Thought Processes:Coherent  Descriptions of Associations:Intact  Orientation:Full (Time, Place and Person)  Thought Content:Logical - denies AVH, paranoia, ideas of reference, first rank symptoms, or delusions. Improvement in residual ruminations.  History of Schizophrenia/Schizoaffective disorder:No  Duration of Psychotic Symptoms:No data recorded Hallucinations:Hallucinations: None  Ideas of Reference:None  Suicidal Thoughts:Suicidal Thoughts: No  Homicidal Thoughts:Homicidal Thoughts: No   Sensorium  Memory:Immediate Good; Recent Good; Remote Good  Judgment:Intact  Insight:Fair   Executive Functions  Concentration:Fair  Attention Span:Fair  Recall:Fair  Fund of Knowledge:Fair  Language:Fair   Psychomotor Activity  Psychomotor Activity:Psychomotor Activity: Normal   Assets  Assets:Communication Skills; Desire for Improvement; Physical Health; Resilience; Talents/Skills   Sleep  Sleep:6.5 hours   Physical Exam: Physical Exam Vitals and nursing note  reviewed.  Constitutional:      Appearance: Normal appearance. He is normal weight.  HENT:     Head: Normocephalic and atraumatic.  Pulmonary:     Effort: Pulmonary effort is normal.  Neurological:     General: No focal deficit present.     Mental Status: He is oriented to person, place, and time.   Review of Systems  Respiratory:  Negative for shortness of breath.   Cardiovascular:  Negative for chest pain.  Gastrointestinal:  Negative for abdominal pain, constipation, diarrhea, heartburn, nausea and vomiting.  Neurological:  Negative for headaches.  Blood pressure (!) 124/93, pulse 94, temperature 97.7 F (36.5 C), temperature source Oral, resp. rate 18, height 6' (1.829 m), weight 83.9 kg, SpO2 97 %. Body mass index is 25.09 kg/m.   Treatment Plan Summary: Daily contact with patient to assess and evaluate symptoms and progress in treatment and Medication management  ASSESSMENT William [name] is a 20 y.o. male with a past history of anxiety, hypertension and migraines that presented with EMS for suicidal and homicidal ideation over the week prior to admission.  Patient states he is feeling much better than since admission.  Patient states that he few and has less negative ruminative thoughts as well as denies SI/HI/AVH.  No physical complaints at  this time.  Patient excited about discharge tomorrow.  PHP scheduled to start on October 11.  PLAN Psychiatric Problems MDD, severe w/o psychosis -Discontinued Wellbutrin XL 150 mg qd for depression -Discontinued Risperdal 3 mg qhs for auditory hallucination and rumination/perseveration -Prozac 40 mg for depression for anxiety/depression -Seroquel XR 50 mg for continued ruminations and perseveration  Cannabis use (r/o cannabis use d/o) - Counseled on need to abstain from [facility] use after discharge  Insomnia -Discontinued Trazodone 25 mg prn qhs and repeat x1 prn for sleep -Seroquel XR 50 mg for insomnia  Medical Problems -TSH, A1c,  lipid panel wnl  PRNs Tylenol 650 mg for moderate pain Hydroxyzine 25 mg tid prn for anxiety Hydroxyzine 50 mg qhs prn for sleep Nicorette gum 2 mg for smoking cessation  3. Safety and Monitoring: Voluntary admission to inpatient psychiatric unit for safety, stabilization and treatment Daily contact with patient to assess and evaluate symptoms and progress in treatment Patient's case to be discussed in multi-disciplinary team meeting Observation Level : q15 minute checks Vital signs: q12 hours Precautions: suicide, elopement, and assault   4. Discharge Planning: Social work and case management to assist with discharge planning and identification of hospital follow-up needs prior to discharge Estimated LOS: 1 day Discharge Concerns: Need to establish a safety plan; Medication compliance and effectiveness Discharge Goals: Return home with outpatient referrals for mental health follow-up including medication management/psychotherapy   Park Pope, MD 03/31/2021, 10:10 AM

## 2021-03-31 NOTE — Progress Notes (Addendum)
   03/31/21 0626  Vital Signs  Temp 97.7 F (36.5 C)  Temp Source Oral  Pulse Rate 79  BP (!) 135/97  BP Location Left Arm  BP Method Automatic  Patient Position (if appropriate) Sitting  Oxygen Therapy  SpO2 97 %   D: Patient denies SI/HI/AVH. Patient rated anxiety 3/10 and depression 5/10. Pt. Was out in open areas and attended group. Pt. Rated sleep "good" and that the Seroquel and vistaril were helpful.  A:  Patient took scheduled medicine. Pt was given 25 mg of Vistaril for anxiety and nicorette for cravings.  Support and encouragement provided. Routine safety checks conducted every 15 minutes. Patient  Informed to notify staff with any concerns.   R:  Safety maintained.

## 2021-04-01 MED ORDER — QUETIAPINE FUMARATE 50 MG PO TABS
50.0000 mg | ORAL_TABLET | Freq: Once | ORAL | Status: AC
  Administered 2021-04-01: 50 mg via ORAL

## 2021-04-01 MED ORDER — QUETIAPINE FUMARATE 50 MG PO TABS
50.0000 mg | ORAL_TABLET | Freq: Every day | ORAL | Status: DC
  Filled 2021-04-01 (×2): qty 1

## 2021-04-01 NOTE — Group Note (Signed)
Recreation Therapy Group Note   Group Topic:Team Building  Group Date: 04/01/2021 Start Time: 1010 End Time: 1050 Facilitators: Caroll Rancher, LRT/CTRS Location: [address] Dayroom  Goal Area(s) Addresses:  Patient will effectively work with peer towards shared goal.  Patient will identify skills used to make activity successful.  Patient will identify how skills used during activity can be applied to reach post d/c goals.   Group Description:Tallest [employer]. In teams of 5-6, patients were given 25 small craft pipe cleaners. Using the materials provided, patients were instructed to compete againt the opposing team(s) to build the tallest free-standing structure from floor level. The activity was timed; difficulty increased by Clinical research associate as Production designer, theatre/television/film continued.  Systematically resources were removed with additional directions for example, placing one arm behind their back, working in silence, and shape stipulations. LRT facilitated post-activity discussion reviewing team processes and necessary communication skills involved in completion. Patients were encouraged to reflect how the skills utilized, or not utilized, in this activity can be incorporated to positively impact support systems post discharge.  Affect/Mood: N/A   Participation Level: None   Participation Quality: N/A   Behavior: N/A   Speech/Thought Process: N/A   Insight: N/A   Judgement: N/A   Modes of Intervention: Problem-solving   Patient Response to Interventions:  N/A   Education Outcome:  Acknowledges education and In group clarification offered    Clinical Observations/Individualized Feedback:  Pt came into group as it was wrapping up.    Plan: Continue to engage patient in RT group sessions 2-3x/week.   Caroll Rancher, LRT/CTRS  04/01/2021 12:47 PM

## 2021-04-01 NOTE — Progress Notes (Signed)
Pt discharged to lobby. Pt was stable and appreciative at that time. All papers and prescriptions were given and valuables returned. Verbal understanding expressed. Denies SI/HI and A/VH. Pt given opportunity to express concerns and ask questions.  
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
## 2021-04-01 NOTE — Progress Notes (Signed)
Pt up requesting something else to help him sleep. Np- Jason ordered 1x 50 mg Seroquel.

## 2021-04-01 NOTE — [facility] Group Notes (Signed)
Patient stated that he wanted to think more positive thoughts  than negative thoughts. Explained to patient that thought, feelings and action are all connected and if you change theses patterns of thinking, the outcomes will be different too. Encouraged Patient to work toward that goal in order to develop a sense of well-being.

## 2021-04-01 NOTE — [facility] Suicide Risk Assessment (Addendum)
[facility] Discharge Suicide Risk Assessment   Principal Problem: MDD (major depressive disorder), recurrent severe, without psychosis (HCC) Discharge Diagnoses: Principal Problem:   MDD (major depressive disorder), recurrent severe, without psychosis (HCC)  Total Time Spent in Direct Patient Care:  I personally spent 45 minutes on the unit in direct patient care. The direct patient care time included face-to-face time with the patient, reviewing the patient's chart, communicating with other professionals, and coordinating care. Greater than 50% of this time was spent in counseling or coordinating care with the patient regarding goals of hospitalization, psycho-education, and discharge planning needs.  Patient is discussed in treatment team.  He is seen on the unit.  Patient reports that, " I woke up today and my anxiety wasn't here."  He is able to list coping strategies, and is forward planning for employment and socialization after discharge. He is made aware of resources for mental health crisis to include the text crisis line and behavioral health urgent care. He denies SI, HI, AVH.  He does not have access to weapons.  He is appreciative of the support he has received here.    Musculoskeletal: Strength & Muscle Tone: within normal limits Gait & Station: normal Patient leans: N/A  Psychiatric Specialty Exam  Presentation  General Appearance: Appropriate for Environment; Casual  Eye Contact:Fair  Speech:Normal Rate  Speech Volume:Normal  Handedness:Right   Mood and Affect  Mood:Anxious  Duration of Depression Symptoms: Greater than two weeks  Affect:Congruent   Thought Process  Thought Processes:Linear; Goal Directed  Descriptions of Associations:Intact  Orientation:Full (Time, Place and Person)  Thought Content:Logical  History of Schizophrenia/Schizoaffective disorder:No  Duration of Psychotic Symptoms:No data recorded Hallucinations:Hallucinations: None  Ideas of  Reference:None  Suicidal Thoughts:Suicidal Thoughts: No  Homicidal Thoughts:Homicidal Thoughts: No  Sensorium  Memory:Immediate Good; Recent Good; Remote Good  Judgment:Fair  Insight:Other (comment) (improving)   Executive Functions  Concentration:Good  Attention Span:Good  Recall:Good  Fund of Knowledge:Good  Language:Good   Psychomotor Activity  Psychomotor Activity: Psychomotor Activity: Normal  Assets  Assets:Communication Skills; Desire for Improvement; Housing; Leisure Time; Physical Health; Resilience; Talents/Skills; Social Support   Sleep  Sleep: Sleep: Fair Number of Hours of Sleep: 4  Physical Exam: Physical Exam ROS Blood pressure 133/87, pulse (!) 108, temperature 97.7 F (36.5 C), resp. rate 18, height 6' (1.829 m), weight 83.9 kg, SpO2 97 %. Body mass index is 25.09 kg/m. Vitals and nursing note reviewed. Exam conducted with a chaperone present.  Constitutional:      Appearance: Normal appearance.  HENT:     Head: Normocephalic.  Eyes:     Extraocular Movements: Extraocular movements intact.  Cardiovascular:     Rate and Rhythm: Normal rate.  Pulmonary:     Effort: Pulmonary effort is normal. No respiratory distress.  Musculoskeletal:        General: Normal range of motion.     Cervical back: Normal range of motion.  Neurological:     General: No focal deficit present.     Mental Status: He is alert and oriented to person, place, and time.    Review of Systems  Constitutional: Negative.   Respiratory: Negative.    Cardiovascular: Negative.   Gastrointestinal: Negative.   Musculoskeletal: Negative.   Neurological: Negative.   Psychiatric/Behavioral:  Negative for depression, hallucinations, memory loss, substance abuse and suicidal ideas. The patient is not nervous/anxious and does not have insomnia.    Mental Status Per Nursing Assessment::   On Admission:  Self-harm thoughts  Demographic Factors:  Adolescent or young adult  and Caucasian  Loss Factors: Decrease in vocational status and inadequate transportation.  Historical Factors: Family history of mental illness or substance abuse and grief reaction since death of grandmother  Risk Reduction Factors:   Sense of responsibility to family, Living with another person, especially a relative, Positive social support, and Positive coping skills or problem solving skills  Continued Clinical Symptoms:  Depression  Cognitive Features That Contribute To Risk:  None    Suicide Risk:  Minimal: No identifiable suicidal ideation.  Patients presenting with no risk factors but with morbid ruminations; may be classified as minimal risk based on the severity of the depressive symptoms   Follow-up Information     [facility]. Call.   Specialty: Hospice and Palliative Medicine Why: Please call to personally schedule an appointment for bereavement/grief therapy services. Contact information: [address] [place] [address] [phone]        [facility] [place] Follow up on 04/13/2021.   Specialty: Behavioral Health Why: You have an appointment for medication management services on 04/13/21 at 11:00 am.  This is a Virtual appointment. Contact information: [address] [place] [address] [phone]        [facility] [place]. Go on 04/07/2021.   Specialty: Behavioral Health Why: You have an appointment on 04/07/21 at 9:00 am for therapy services.  This appointment will be held in [facility].  Please wear a mask to your appointment, and bring photo ID and insurance card. Contact information: [address] [id number] mc [place] [address] [phone]        BEHAVIORAL HEALTH PARTIAL HOSPITALIZATION PROGRAM Follow up on 04/13/2021.   Specialty: Behavioral Health Why: You have an appointment to start the partial  hospitalization program for intensive therapy, and medication management services on 04/13/21 at 2:00 pm.  This will be a Virtual appointment. Contact information: [address] [phone] mc [place] [address] [phone]                Plan Of Care/Follow-up recommendations:  Activity:  ad lib Diet:  as tolerated On day of discharge following sustained improvement in the affect of this patient, continued report of euthymic mood, repeated denial of suicidal, homicidal, and other violent ideation, adequate interaction with peers, active participation in groups while on the unit, and denial of adverse reactions from medications, the treatment team decided [name] was stable for discharge home with scheduled mental health treatment as noted above.  He was able to engage in safety planning including plan to return to [facility] or contact emergency services if he feels unable to maintain his own safety or the safety of others. Patient had no further questions, comments, or concerns. Discharge into care of father, who agrees to maintain patient safety.  Patient aware to return to nearest crisis center, ED or to call 911 for worsening symptoms of depression, suicidal or homicidal thoughts or AVH.   Mariel Craft, MD 04/01/2021, 6:26 PM

## 2021-04-01 NOTE — Progress Notes (Signed)
  [facility] Adult Case Management Discharge Plan :  Will you be returning to the same living situation after discharge:  Yes,  w/ dad At discharge, do you have transportation home?: Yes,  via dad Do you have the ability to pay for your medications: Yes,  private insurance   Release of information consent forms completed and in the chart;  Patient's signature needed at discharge.  Patient to Follow up at:  Follow-up Information     [facility]. Call.   Specialty: Hospice and Palliative Medicine Why: Please call to personally schedule an appointment for bereavement/grief therapy services. Contact information: [address] [place] [address] [phone]        [facility] [place] Follow up on 04/13/2021.   Specialty: Behavioral Health Why: You have an appointment for medication management services on 04/13/21 at 11:00 am.  This is a Virtual appointment. Contact information: [address] [place] [address] [phone]        BEHAVIORAL HEALTH OUTPATIENT THERAPY [place]. Go on 04/07/2021.   Specialty: Behavioral Health Why: You have an appointment on 04/07/21 at 9:00 am for therapy services.  This appointment will be held in [facility].  Please wear a mask to your appointment, and bring photo ID and insurance card. Contact information: [address] [id number] mc [place] [address] [phone]        BEHAVIORAL HEALTH PARTIAL HOSPITALIZATION PROGRAM Follow up on 04/13/2021.   Specialty: Behavioral Health Why: You have an appointment to start the partial hospitalization program for intensive therapy, and medication management services on 04/13/21 at 2:00 pm.  This will be a Virtual appointment. Contact information: [phone] mc [place] [address] [phone]                Next level of care provider has access to [facility] Link:yes  Safety Planning and Suicide Prevention discussed: Yes,  dad     Has patient been referred to the Quitline?: N/A patient is not a smoker  Patient has been referred for addiction treatment: N/A  Felizardo Hoffmann, LCSWA 04/01/2021, 9:59 AM

## 2021-04-07 ENCOUNTER — Ambulatory Visit (HOSPITAL_COMMUNITY): Payer: BC Managed Care – PPO | Admitting: Licensed Clinical Social Worker

## 2021-04-13 ENCOUNTER — Other Ambulatory Visit: Payer: Self-pay

## 2021-04-13 ENCOUNTER — Telehealth (HOSPITAL_COMMUNITY): Payer: Self-pay | Admitting: Licensed Clinical Social Worker

## 2021-04-13 ENCOUNTER — Telehealth (HOSPITAL_COMMUNITY): Payer: BC Managed Care – PPO | Admitting: Psychiatry

## 2021-04-13 ENCOUNTER — Other Ambulatory Visit (HOSPITAL_COMMUNITY): Payer: BC Managed Care – PPO | Attending: Psychiatry

## 2021-05-04 ENCOUNTER — Ambulatory Visit (HOSPITAL_COMMUNITY): Payer: BC Managed Care – PPO | Admitting: Licensed Clinical Social Worker

## 2021-06-30 ENCOUNTER — Ambulatory Visit (HOSPITAL_COMMUNITY)
Admission: RE | Admit: 2021-06-30 | Discharge: 2021-06-30 | Disposition: A | Discharge status: Home / Self Care | Payer: BC Managed Care – PPO | Attending: Psychiatry | Admitting: Psychiatry
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
## 2021-09-18 ENCOUNTER — Emergency Department (HOSPITAL_BASED_OUTPATIENT_CLINIC_OR_DEPARTMENT_OTHER)
Admission: EM | Admit: 2021-09-18 | Discharge: 2021-09-18 | Disposition: A | Discharge status: Home / Self Care | Payer: BC Managed Care – PPO | Attending: Emergency Medicine | Admitting: Emergency Medicine

## 2021-09-18 ENCOUNTER — Other Ambulatory Visit: Payer: Self-pay

## 2021-09-18 ENCOUNTER — Encounter (HOSPITAL_BASED_OUTPATIENT_CLINIC_OR_DEPARTMENT_OTHER): Payer: Self-pay | Admitting: Emergency Medicine

## 2021-09-18 DIAGNOSIS — R45851 Suicidal ideations: Secondary | ICD-10-CM | POA: Insufficient documentation

## 2021-09-18 DIAGNOSIS — F1220 Cannabis dependence, uncomplicated: Secondary | ICD-10-CM | POA: Insufficient documentation

## 2021-09-18 DIAGNOSIS — F99 Mental disorder, not otherwise specified: Secondary | ICD-10-CM | POA: Diagnosis not present

## 2021-09-18 DIAGNOSIS — F419 Anxiety disorder, unspecified: Secondary | ICD-10-CM | POA: Insufficient documentation

## 2021-09-18 DIAGNOSIS — I10 Essential (primary) hypertension: Secondary | ICD-10-CM | POA: Diagnosis not present

## 2021-09-18 DIAGNOSIS — F32A Depression, unspecified: Secondary | ICD-10-CM

## 2021-09-18 DIAGNOSIS — F332 Major depressive disorder, recurrent severe without psychotic features: Secondary | ICD-10-CM | POA: Insufficient documentation

## 2021-09-18 DIAGNOSIS — Z20822 Contact with and (suspected) exposure to covid-19: Secondary | ICD-10-CM | POA: Diagnosis not present

## 2021-09-18 LAB — COMPREHENSIVE METABOLIC PANEL
ALT: 18 U/L (ref 0–44)
AST: 17 U/L (ref 15–41)
Albumin: 4.8 g/dL (ref 3.5–5.0)
Alkaline Phosphatase: 64 U/L (ref 38–126)
Anion gap: 10 (ref 5–15)
BUN: 10 mg/dL (ref 6–20)
CO2: 25 mmol/L (ref 22–32)
Calcium: 9.5 mg/dL (ref 8.9–10.3)
Chloride: 102 mmol/L (ref 98–111)
Creatinine, Ser: 0.94 mg/dL (ref 0.61–1.24)
GFR, Estimated: >60 mL/min (ref >60)
Glucose, Bld: 116 mg/dL — ABNORMAL HIGH (ref 70–99)
Potassium: 3.6 mmol/L (ref 3.5–5.1)
Sodium: 137 mmol/L (ref 135–145)
Total Bilirubin: 2.5 mg/dL — ABNORMAL HIGH (ref 0.3–1.2)
Total Protein: 7.4 g/dL (ref 6.5–8.1)

## 2021-09-18 LAB — CBC WITH DIFFERENTIAL/PLATELET
Abs Immature Granulocytes: 0.04 10*3/uL (ref 0.00–0.07)
Basophils Absolute: 0.1 10*3/uL (ref 0.0–0.1)
Basophils Relative: 1 %
Eosinophils Absolute: 0 10*3/uL (ref 0.0–0.5)
Eosinophils Relative: 1 %
HCT: 47.8 % (ref 39.0–52.0)
Hemoglobin: 16.9 g/dL (ref 13.0–17.0)
Immature Granulocytes: 1 %
Lymphocytes Relative: 24 %
Lymphs Abs: 2 10*3/uL (ref 0.7–4.0)
MCH: 30.5 pg (ref 26.0–34.0)
MCHC: 35.4 g/dL (ref 30.0–36.0)
MCV: 86.3 fL (ref 80.0–100.0)
Monocytes Absolute: 0.6 10*3/uL (ref 0.1–1.0)
Monocytes Relative: 7 %
Neutro Abs: 5.5 10*3/uL (ref 1.7–7.7)
Neutrophils Relative %: 66 %
Platelets: 268 10*3/uL (ref 150–400)
RBC: 5.54 MIL/uL (ref 4.22–5.81)
RDW: 12.1 % (ref 11.5–15.5)
WBC: 8.2 10*3/uL (ref 4.0–10.5)
nRBC: 0 % (ref 0.0–0.2)

## 2021-09-18 LAB — RAPID URINE DRUG SCREEN, HOSP PERFORMED
Amphetamines: NOT DETECTED
Barbiturates: NOT DETECTED
Benzodiazepines: POSITIVE — AB
Cocaine: NOT DETECTED
Opiates: NOT DETECTED
Tetrahydrocannabinol: POSITIVE — AB

## 2021-09-18 LAB — SALICYLATE LEVEL: Salicylate Lvl: <7 mg/dL — ABNORMAL LOW (ref 7.0–30.0)

## 2021-09-18 LAB — RESP PANEL BY RT-PCR (FLU A&B, COVID) ARPGX2
Influenza A by PCR: NEGATIVE
Influenza B by PCR: NEGATIVE
SARS Coronavirus 2 by RT PCR: NEGATIVE

## 2021-09-18 LAB — ACETAMINOPHEN LEVEL: Acetaminophen (Tylenol), Serum: <10 ug/mL — ABNORMAL LOW (ref 10–30)

## 2021-09-18 LAB — ETHANOL: Alcohol, Ethyl (B): <10 mg/dL (ref <10)

## 2021-09-18 NOTE — ED Notes (Signed)
Called [facility] at 6:10 and said that they are short handed today and that there are 2 Patients ahead of him. ?

## 2021-09-18 NOTE — ED Notes (Signed)
Pt called mom for ride home, states safe place to reside tonight at moms home.   ?Pt given belongings and discharge instructions. Waiting for ride home ?

## 2021-09-18 NOTE — ED Notes (Signed)
William [name], mother called, requesting to speak to her son. Pt refuses at present ?

## 2021-09-18 NOTE — ED Notes (Signed)
TTS at bedside to evaluate pt ?
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
## 2021-09-18 NOTE — ED Triage Notes (Signed)
Pt states " I don't want to be here, and I need to get sober and get my life together." Pt reports a hx of self harm but states he has not done anything to harm himself. He states he is seeking mental health assistance and help with addiction.  ?

## 2021-09-18 NOTE — ED Provider Notes (Signed)
?[facility] EM[facility] ?Provider Note ? ?CSN: [id number] ?Arrival date & time: 09/18/21 0401 ? ?Chief Complaint(s) ?Mental Health Problem ? ?HPI ?William [name] is a 21 y.o. male with a past medical history listed below who presents for depression and suicidal ideation.  No active plan.  States that he needs to get his life together and feels worthless.  He wants to get off of alcohol and drugs.  Denies any homicidal ideation or hallucinations. ? ?HPI ? ?Past Medical History ?Past Medical History:  ?Diagnosis Date  ? Anxiety   ? Hypertension   ? Migraine   ? ?Patient Active Problem List  ? Diagnosis Date Noted  ? Marijuana use 03/26/2021  ? MDD (major depressive disorder), recurrent severe, without psychosis (HCC) 03/25/2021  ? ?Home Medication(s) ?Prior to Admission medications   ?Medication Sig Start Date End Date Taking? Authorizing Provider  ?FLUoxetine (PROZAC) 40 MG capsule Take 1 capsule (40 mg total) by mouth daily. 04/01/21 05/01/21  Park Pope, MD  ?nicotine polacrilex (NICORETTE) 2 MG gum Take 1 each (2 mg total) by mouth as needed for up to 50 doses for smoking cessation. 03/31/21   Park Pope, MD  ?QUEtiapine (SEROQUEL XR) 50 MG TB24 24 hr tablet Take 1 tablet (50 mg total) by mouth at bedtime. 03/31/21 04/30/21  Park Pope, MD  ?                                                                                                                                  ?Allergies ?Patient has no known allergies. ? ?Review of Systems ?Review of Systems ?As noted in HPI ? ?Physical Exam ?Vital Signs  ?I have reviewed the triage vital signs ?BP (!) 137/93 (BP Location: Right Arm)   Pulse (!) 109   Temp 97.7 ?F (36.5 ?C) (Oral)   Resp 20   Ht 6' (1.829 m)   Wt 93 kg   SpO2 95%   BMI 27.80 kg/m?  ? ?Physical Exam ?Vitals reviewed.  ?Constitutional:   ?   General: He is not in acute distress. ?   Appearance: He is well-developed. He is not diaphoretic.  ?HENT:  ?   Head: Normocephalic and  atraumatic.  ?   Right Ear: External ear normal.  ?   Left Ear: External ear normal.  ?   Nose: Nose normal.  ?   Mouth/Throat:  ?   Mouth: Mucous membranes are moist.  ?Eyes:  ?   General: No scleral icterus. ?   Conjunctiva/sclera: Conjunctivae normal.  ?Neck:  ?   Trachea: Phonation normal.  ?Cardiovascular:  ?   Rate and Rhythm: Normal rate and regular rhythm.  ?Pulmonary:  ?   Effort: Pulmonary effort is normal. No respiratory distress.  ?   Breath sounds: No stridor.  ?Abdominal:  ?   General: There is no distension.  ?Musculoskeletal:     ?   General: Normal range of  motion.  ?   Cervical back: Normal range of motion.  ?Neurological:  ?   Mental Status: He is alert and oriented to person, place, and time.  ?Psychiatric:     ?   Behavior: Behavior normal.  ? ? ?ED Results and Treatments ?Labs ?(all labs ordered are listed, but only abnormal results are displayed) ?Labs Reviewed  ?COMPREHENSIVE METABOLIC PANEL - Abnormal; Notable for the following components:  ?    Result Value  ? Glucose, Bld 116 (*)   ? Total Bilirubin 2.5 (*)   ? All other components within normal limits  ?RAPID URINE DRUG SCREEN, HOSP PERFORMED - Abnormal; Notable for the following components:  ? Benzodiazepines POSITIVE (*)   ? Tetrahydrocannabinol POSITIVE (*)   ? All other components within normal limits  ?SALICYLATE LEVEL - Abnormal; Notable for the following components:  ? Salicylate Lvl <7.0 (*)   ? All other components within normal limits  ?ACETAMINOPHEN LEVEL - Abnormal; Notable for the following components:  ? Acetaminophen (Tylenol), Serum <10 (*)   ? All other components within normal limits  ?RESP PANEL BY RT-PCR (FLU A&B, COVID) ARPGX2  ?ETHANOL  ?CBC WITH DIFFERENTIAL/PLATELET  ?                                                                                                                       ?EKG ? EKG Interpretation ? ?Date/Time:  Sunday September 18 2021 06:30:29 EDT ?Ventricular Rate:  77 ?PR Interval:  205 ?QRS  Duration: 109 ?QT Interval:  396 ?QTC Calculation: 449 ?R Axis:   101 ?Text Interpretation: Sinus rhythm Borderline prolonged PR interval Borderline right axis deviation Early repolarization pattern Confirmed by Drema Pryardama, Milbert Bixler [phone]([id number]) on 09/18/2021 6:48:29 AM ?  ? ?  ? ?Radiology ?No results found. ? ?Pertinent labs & imaging results that were available during my care of the patient were reviewed by me and considered in my medical decision making (see MDM for details). ? ?Medications Ordered in ED ?Medications - No data to display                                                               ?                                                                    ?Procedures ?Procedures ? ?(including critical care time) ? ?Medical Decision Making / ED Course ? ? ? Complexity of Problem: ? ?Co-morbidities/SDOH that complicate the patient evaluation/care: ?none ? ?Additional history obtained: ?none ? ?Patient's presenting  problem/concern and DDX listed below: ?Depression and SI ?Will get basic labs for medical clearance. ? ? ?  Complexity of Data: ?  ?Cardiac Monitoring: ?none ? ?Laboratory Tests ordered listed below with my independent interpretation: ?CBC without leukocytosis or anemia ?No significant electrolyte derangements or renal sufficiency ?Acetaminophen and salicylate level negative ?  ?Imaging Studies ordered listed below with my independent interpretation: ?none ?  ?  ?ED Course:   ? ?Hospitalization Considered:  ?yes ? ?Assessment, Intervention, and Reassessment: ?Depression and SI ?No active plan ?Medically cleared ?[facility] consultation  ? ? ?Final Clinical Impression(s) / ED Diagnoses ?Final diagnoses:  ?Depression, unspecified depression type  ?Suicidal ideation  ? ? ?  ? ? ? ? ? ?This chart was dictated using voice recognition software.  Despite best efforts to proofread,  errors can occur which can change the documentation meaning. ? ?  ?Nira Conn, MD ?09/18/21 [phone] ? ?

## 2021-09-18 NOTE — Discharge Instructions (Signed)
Please follow-up with the behavioral health intensive program that has been outlined above. ?Call [phone] if you are having any psychiatry crisis. ?

## 2021-09-18 NOTE — ED Notes (Signed)
Pt taken to room 12, dressed out in psych scrubs, room cleared of cords, etc. All pt belongings placed in a labeled bag and placed in the cabinet. Pt wanded by security.  ?

## 2021-09-18 NOTE — ED Notes (Signed)
Pt given cordless phone to call his mother. ?

## 2021-09-18 NOTE — ED Provider Notes (Signed)
Patient has been cleared by psychiatry service.  Outpatient referral and information provided.  Patient will start intense outpatient therapy tomorrow.  Stable for discharge. ?  ?Derwood Kaplan, MD ?09/18/21 2159 ? ?

## 2021-09-18 NOTE — ED Notes (Signed)
Given in snacks and soda. Plan of care and delay explained . Polite, cooperative, good eye contact ?

## 2021-10-04 ENCOUNTER — Other Ambulatory Visit: Payer: Self-pay

## 2021-10-04 ENCOUNTER — Encounter (HOSPITAL_COMMUNITY): Payer: Self-pay | Admitting: Family

## 2021-10-04 ENCOUNTER — Ambulatory Visit (HOSPITAL_COMMUNITY)
Admission: AD | Admit: 2021-10-04 | Discharge: 2021-10-04 | Disposition: A | Discharge status: Home / Self Care | Payer: BC Managed Care – PPO | Attending: Psychiatry | Admitting: Psychiatry

## 2021-10-04 ENCOUNTER — Inpatient Hospital Stay (HOSPITAL_COMMUNITY)
Admission: AD | Admit: 2021-10-04 | Discharge: 2021-10-11 | DRG: 885 | Disposition: A | Discharge status: Home / Self Care | Payer: BC Managed Care – PPO | Source: Intra-hospital | Attending: Psychiatry | Admitting: Psychiatry

## 2021-10-04 DIAGNOSIS — I10 Essential (primary) hypertension: Secondary | ICD-10-CM | POA: Diagnosis present

## 2021-10-04 DIAGNOSIS — F429 Obsessive-compulsive disorder, unspecified: Secondary | ICD-10-CM | POA: Diagnosis present

## 2021-10-04 DIAGNOSIS — F410 Panic disorder [episodic paroxysmal anxiety] without agoraphobia: Secondary | ICD-10-CM | POA: Diagnosis present

## 2021-10-04 DIAGNOSIS — Z20822 Contact with and (suspected) exposure to covid-19: Secondary | ICD-10-CM | POA: Diagnosis present

## 2021-10-04 DIAGNOSIS — G4700 Insomnia, unspecified: Secondary | ICD-10-CM | POA: Diagnosis present

## 2021-10-04 DIAGNOSIS — R45851 Suicidal ideations: Secondary | ICD-10-CM | POA: Diagnosis present

## 2021-10-04 DIAGNOSIS — F332 Major depressive disorder, recurrent severe without psychotic features: Secondary | ICD-10-CM | POA: Diagnosis present

## 2021-10-04 DIAGNOSIS — Z560 Unemployment, unspecified: Secondary | ICD-10-CM | POA: Diagnosis not present

## 2021-10-04 DIAGNOSIS — Z9152 Personal history of nonsuicidal self-harm: Secondary | ICD-10-CM | POA: Diagnosis not present

## 2021-10-04 DIAGNOSIS — F4310 Post-traumatic stress disorder, unspecified: Secondary | ICD-10-CM | POA: Diagnosis present

## 2021-10-04 DIAGNOSIS — Z91199 Patient's noncompliance with other medical treatment and regimen due to unspecified reason: Secondary | ICD-10-CM | POA: Diagnosis not present

## 2021-10-04 DIAGNOSIS — F329 Major depressive disorder, single episode, unspecified: Secondary | ICD-10-CM | POA: Insufficient documentation

## 2021-10-04 DIAGNOSIS — F1220 Cannabis dependence, uncomplicated: Secondary | ICD-10-CM | POA: Diagnosis present

## 2021-10-04 LAB — RESP PANEL BY RT-PCR (FLU A&B, COVID) ARPGX2
Influenza A by PCR: NEGATIVE
Influenza B by PCR: NEGATIVE
SARS Coronavirus 2 by RT PCR: NEGATIVE

## 2021-10-04 MED ORDER — ACETAMINOPHEN 325 MG PO TABS
650.0000 mg | ORAL_TABLET | Freq: Four times a day (QID) | ORAL | Status: DC | PRN
  Administered 2021-10-10 – 2021-10-11 (×2): 650 mg via ORAL
  Filled 2021-10-04 (×2): qty 2

## 2021-10-04 MED ORDER — ALUM & MAG HYDROXIDE-SIMETH 200-200-20 MG/5ML PO SUSP
30.0000 mL | ORAL | Status: DC | PRN
  Filled 2021-10-04: qty 30

## 2021-10-04 MED ORDER — MAGNESIUM HYDROXIDE 400 MG/5ML PO SUSP
30.0000 mL | Freq: Every day | ORAL | Status: DC | PRN
Start: 2021-10-04 — End: 2021-10-11

## 2021-10-04 MED ORDER — NICOTINE 14 MG/24HR TD PT24
14.0000 mg | MEDICATED_PATCH | Freq: Every day | TRANSDERMAL | Status: DC
Start: 2021-10-04 — End: 2021-10-04
  Filled 2021-10-04 (×2): qty 1

## 2021-10-04 MED ORDER — MAGNESIUM HYDROXIDE 400 MG/5ML PO SUSP
30.0000 mL | Freq: Every day | ORAL | Status: DC | PRN
  Filled 2021-10-04: qty 30

## 2021-10-04 MED ORDER — ZIPRASIDONE MESYLATE 20 MG IM SOLR: 20.0000 mg | INTRAMUSCULAR | Status: DC | PRN

## 2021-10-04 MED ORDER — NICOTINE POLACRILEX 2 MG MT GUM
2.0000 mg | CHEWING_GUM | OROMUCOSAL | Status: DC | PRN
  Administered 2021-10-05 – 2021-10-11 (×20): 2 mg via ORAL
  Filled 2021-10-04 (×16): qty 1

## 2021-10-04 MED ORDER — OLANZAPINE 5 MG PO TBDP: 5.0000 mg | ORAL_TABLET | Freq: Three times a day (TID) | ORAL | Status: DC | PRN

## 2021-10-04 MED ORDER — ALUM & MAG HYDROXIDE-SIMETH 200-200-20 MG/5ML PO SUSP: 30.0000 mL | ORAL | Status: DC | PRN

## 2021-10-04 MED ORDER — HYDROXYZINE HCL 25 MG PO TABS
25.0000 mg | ORAL_TABLET | Freq: Three times a day (TID) | ORAL | Status: DC | PRN
  Administered 2021-10-06 – 2021-10-08 (×2): 25 mg via ORAL
  Filled 2021-10-04 (×3): qty 1

## 2021-10-04 MED ORDER — ACETAMINOPHEN 325 MG PO TABS
650.0000 mg | ORAL_TABLET | Freq: Four times a day (QID) | ORAL | Status: DC | PRN
  Filled 2021-10-04: qty 2

## 2021-10-04 MED ORDER — LORAZEPAM 1 MG PO TABS: 1.0000 mg | ORAL_TABLET | ORAL | Status: DC | PRN

## 2021-10-04 MED ORDER — TRAZODONE HCL 50 MG PO TABS
50.0000 mg | ORAL_TABLET | Freq: Every evening | ORAL | Status: DC | PRN
  Filled 2021-10-04: qty 1

## 2021-10-04 MED ORDER — TRAZODONE HCL 50 MG PO TABS
50.0000 mg | ORAL_TABLET | Freq: Every evening | ORAL | Status: DC | PRN
  Administered 2021-10-04 – 2021-10-10 (×7): 50 mg via ORAL
  Filled 2021-10-04 (×7): qty 1

## 2021-10-04 MED ORDER — HYDROXYZINE HCL 25 MG PO TABS
25.0000 mg | ORAL_TABLET | Freq: Three times a day (TID) | ORAL | Status: DC | PRN
  Filled 2021-10-04: qty 1
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
## 2021-10-04 NOTE — BH Assessment (Deleted)
  The note originally documented on this encounter has been moved the the encounter in which it belongs.  
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
## 2021-10-05 ENCOUNTER — Encounter (HOSPITAL_COMMUNITY): Payer: Self-pay

## 2021-10-05 DIAGNOSIS — F332 Major depressive disorder, recurrent severe without psychotic features: Principal | ICD-10-CM

## 2021-10-05 LAB — COMPREHENSIVE METABOLIC PANEL
ALT: 25 U/L (ref 0–44)
AST: 15 U/L (ref 15–41)
Albumin: 4.3 g/dL (ref 3.5–5.0)
Alkaline Phosphatase: 54 U/L (ref 38–126)
Anion gap: 6 (ref 5–15)
BUN: 12 mg/dL (ref 6–20)
CO2: 26 mmol/L (ref 22–32)
Calcium: 9.3 mg/dL (ref 8.9–10.3)
Chloride: 107 mmol/L (ref 98–111)
Creatinine, Ser: 0.81 mg/dL (ref 0.61–1.24)
GFR, Estimated: >60 mL/min (ref >60)
Glucose, Bld: 92 mg/dL (ref 70–99)
Potassium: 3.8 mmol/L (ref 3.5–5.1)
Sodium: 139 mmol/L (ref 135–145)
Total Bilirubin: 1.5 mg/dL — ABNORMAL HIGH (ref 0.3–1.2)
Total Protein: 6.6 g/dL (ref 6.5–8.1)

## 2021-10-05 LAB — CBC
HCT: 46.1 % (ref 39.0–52.0)
Hemoglobin: 15.9 g/dL (ref 13.0–17.0)
MCH: 30.8 pg (ref 26.0–34.0)
MCHC: 34.5 g/dL (ref 30.0–36.0)
MCV: 89.2 fL (ref 80.0–100.0)
Platelets: 216 10*3/uL (ref 150–400)
RBC: 5.17 MIL/uL (ref 4.22–5.81)
RDW: 12.7 % (ref 11.5–15.5)
WBC: 5.6 10*3/uL (ref 4.0–10.5)
nRBC: 0 % (ref 0.0–0.2)

## 2021-10-05 LAB — LIPID PANEL
Cholesterol: 124 mg/dL (ref 0–200)
HDL: 35 mg/dL — ABNORMAL LOW (ref >40)
LDL Cholesterol: 75 mg/dL (ref 0–99)
Total CHOL/HDL Ratio: 3.5 RATIO
Triglycerides: 68 mg/dL (ref <150)
VLDL: 14 mg/dL (ref 0–40)

## 2021-10-05 LAB — TSH: TSH: 2.314 u[IU]/mL (ref 0.350–4.500)

## 2021-10-05 MED ORDER — FLUOXETINE HCL 10 MG PO CAPS
10.0000 mg | ORAL_CAPSULE | Freq: Every day | ORAL | Status: AC
  Administered 2021-10-05: 10 mg via ORAL
  Filled 2021-10-05 (×2): qty 1

## 2021-10-05 MED ORDER — FLUOXETINE HCL 20 MG PO CAPS
20.0000 mg | ORAL_CAPSULE | Freq: Every day | ORAL | Status: DC
  Administered 2021-10-06 – 2021-10-07 (×2): 20 mg via ORAL
  Filled 2021-10-05 (×4): qty 1
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
## 2021-10-05 NOTE — BH Assessment (Deleted)
Note Erased ?
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
## 2021-10-05 NOTE — Progress Notes (Signed)
D:  William [name] was in his room much of the night.  He did come out for medications, snack and to watch TV in the day room.  He denied SI/HI or AVH.  He reports this evening he is feeling better and attributes this to not wanting to die.  He denied any pain or discomfort and appeared to be in no physical distress. ?A:  1:1 with RN for support and encouragement.  Medications given as ordered.  Q 15 minute checks maintained for safety.  Encouraged participation in group and unit activities.   ?R:  He is currently resting with his eyes closed and appears to be asleep.  He remains safe on the unit.   ? ? 10/05/21 2147  ?Psych Admission Type (Psych Patients Only)  ?Admission Status Voluntary  ?Psychosocial Assessment  ?Patient Complaints Depression;Insomnia  ?Eye Contact Fair  ?Facial Expression Flat  ?Affect Depressed  ?Speech Logical/coherent  ?Interaction Assertive  ?Motor Activity Slow  ?Appearance/Hygiene Unremarkable  ?Behavior Characteristics Cooperative;Appropriate to situation  ?Mood Depressed  ?Thought Process  ?Coherency WDL  ?Content Blaming self  ?Delusions None reported or observed  ?Perception WDL  ?Hallucination None reported or observed  ?Judgment WDL  ?Confusion None  ?Danger to Self  ?Current suicidal ideation? Denies  ?Danger to Others  ?Danger to Others None reported or observed  ? ? ?

## 2021-10-05 NOTE — Group Note (Signed)
Recreation Therapy Group Note ? ? ?Group Topic:Leisure Education  ?Group Date: 10/05/2021 ?Start Time: 1005 ?End Time: 1040 ?Facilitators: Caroll Rancher, LRT,CTRS ?Location: [address] Dayroom ? ? ?Goal Area(s) Addresses:  ?Patient will successfully identify positive leisure and recreation activities.  ?Patient will acknowledge benefits of participation in healthy leisure activities post discharge.  ?Patient will actively work with peers toward a shared goal. ?  ?Group Description: Pictionary. In groups, patients took turns trying to guess the picture being drawn on the board by their peers.  If someone guessed the correct answer, they got the next turn.  After several rounds of game play, the group would discuss the importance of leisure in everyday life. Post-activity discussion reviewed benefits of positive recreation outlets: reducing stress, improving coping mechanisms, increasing self-esteem, and building larger support systems. ? ? ?Affect/Mood: N/A ?  ?Participation Level: Did not attend ?  ? ?Clinical Observations/Individualized Feedback:   ? ? ?Plan: Continue to engage patient in RT group sessions 2-3x/week. ? ? ?Caroll Rancher, LRT,CTRS ?10/05/2021 1:02 PM ?

## 2021-10-05 NOTE — Group Note (Signed)
LCSW Group Therapy Note ?  ?  ?Group Date: 10/05/2021 ?Start Time: 1300 ?End Time: 1400 ?  ?Type of Therapy and Topic:  Group Therapy: Self-Esteem  ?  ?Participation Level:  Did not attend ?  ?Description of Group:   Due to infectious disease and illness on the unit, staff recommended no close contact at this time so group was not held.  Patient was provided with written information and worksheets about Self-Esteem.  Pt was sleeping and would not wake to discuss packet. ? ? ? ?Jaelah Hauth MSW, LCSW ?Clincal Social Worker  ?[facility]  ?
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
## 2021-10-05 NOTE — BH Assessment (Deleted)
Note Erased ?

## 2021-10-05 NOTE — BH IP Treatment Plan (Signed)
Interdisciplinary Treatment and Diagnostic Plan Update ? ?10/05/2021 ?Time of Session: 9:40am  ?[name] ?MRN: [id number] ? ?Principal Diagnosis: MDD (major depressive disorder), recurrent episode, severe ([place]) ? ?Secondary Diagnoses: Principal Problem: ?  MDD (major depressive disorder), recurrent episode, severe ([place]) ? ? ?Current Medications:  ?Current Facility-Administered Medications  ?Medication Dose Route Frequency Provider Last Rate Last Admin  ? acetaminophen (TYLENOL) tablet 650 mg  650 mg Oral Q6H PRN Derrill Center, NP      ? alum & mag hydroxide-simeth (MAALOX/MYLANTA) 200-200-20 MG/5ML suspension 30 mL  30 mL Oral Q4H PRN Derrill Center, NP      ? FLUoxetine (PROZAC) capsule 10 mg  10 mg Oral Daily Freida Busman, MD      ? Derrill Memo ON 10/06/2021] FLUoxetine (PROZAC) capsule 20 mg  20 mg Oral Daily Damita Dunnings B, MD      ? hydrOXYzine (ATARAX) tablet 25 mg  25 mg Oral TID PRN Derrill Center, NP      ? OLANZapine zydis (ZYPREXA) disintegrating tablet 5 mg  5 mg Oral Q8H PRN Massengill, Ovid Curd, MD      ? And  ? LORazepam (ATIVAN) tablet 1 mg  1 mg Oral PRN Massengill, Ovid Curd, MD      ? And  ? ziprasidone (GEODON) injection 20 mg  20 mg Intramuscular PRN Massengill, Ovid Curd, MD      ? magnesium hydroxide (MILK OF MAGNESIA) suspension 30 mL  30 mL Oral Daily PRN Derrill Center, NP      ? nicotine polacrilex (NICORETTE) gum 2 mg  2 mg Oral PRN Prescilla Sours, PA-C   2 mg at 10/05/21 1015  ? traZODone (DESYREL) tablet 50 mg  50 mg Oral QHS PRN Derrill Center, NP   50 mg at 10/04/21 2057  ? ?PTA Medications: ?Medications Prior to Admission  ?Medication Sig Dispense Refill Last Dose  ? FLUoxetine (PROZAC) 40 MG capsule Take 1 capsule (40 mg total) by mouth daily. (Patient not taking: Reported on 10/04/2021) 30 capsule 0 Not Taking  ? nicotine polacrilex (NICORETTE) 2 MG gum Take 1 each (2 mg total) by mouth as needed for up to 50 doses for smoking cessation. (Patient taking differently: Take 2 mg by mouth  as needed for smoking cessation.) 100 tablet 0   ? QUEtiapine (SEROQUEL XR) 50 MG TB24 24 hr tablet Take 1 tablet (50 mg total) by mouth at bedtime. (Patient not taking: Reported on 10/04/2021) 30 tablet 0 Not Taking  ? ? ?Patient Stressors: Financial difficulties   ?Occupational concerns   ? ?Patient Strengths: Capable of independent living  ?Motivation for treatment/growth  ?Supportive family/friends  ? ?Treatment Modalities: Medication Management, Group therapy, Case management,  ?1 to 1 session with clinician, Psychoeducation, Recreational therapy. ? ? ?Physician Treatment Plan for Primary Diagnosis: MDD (major depressive disorder), recurrent episode, severe ([place]) ?Long Term Goal(s):    ? ?Short Term Goals:   ? ?Medication Management: Evaluate patient's response, side effects, and tolerance of medication regimen. ? ?Therapeutic Interventions: 1 to 1 sessions, Unit Group sessions and Medication administration. ? ?Evaluation of Outcomes: Not Met ? ?Physician Treatment Plan for Secondary Diagnosis: Principal Problem: ?  MDD (major depressive disorder), recurrent episode, severe ([place]) ? ?Long Term Goal(s):    ? ?Short Term Goals:      ? ?Medication Management: Evaluate patient's response, side effects, and tolerance of medication regimen. ? ?Therapeutic Interventions: 1 to 1 sessions, Unit Group sessions and Medication administration. ? ?Evaluation of  Outcomes: Not Met ? ? ?RN Treatment Plan for Primary Diagnosis: MDD (major depressive disorder), recurrent episode, severe ([place]) ?Long Term Goal(s): Knowledge of disease and therapeutic regimen to maintain health will improve ? ?Short Term Goals: Ability to remain free from injury will improve, Ability to participate in decision making will improve, Ability to verbalize feelings will improve, Ability to disclose and discuss suicidal ideas, and Ability to identify and develop effective coping behaviors will improve ? ?Medication Management: RN will administer medications  as ordered by provider, will assess and evaluate patient's response and provide education to patient for prescribed medication. RN will report any adverse and/or side effects to prescribing provider. ? ?Therapeutic Interventions: 1 on 1 counseling sessions, Psychoeducation, Medication administration, Evaluate responses to treatment, Monitor vital signs and CBGs as ordered, Perform/monitor CIWA, COWS, AIMS and Fall Risk screenings as ordered, Perform wound care treatments as ordered. ? ?Evaluation of Outcomes: Not Met ? ? ?LCSW Treatment Plan for Primary Diagnosis: MDD (major depressive disorder), recurrent episode, severe ([place]) ?Long Term Goal(s): Safe transition to appropriate next level of care at discharge, Engage patient in therapeutic group addressing interpersonal concerns. ? ?Short Term Goals: Engage patient in aftercare planning with referrals and resources, Increase social support, Increase emotional regulation, Facilitate acceptance of mental health diagnosis and concerns, Identify triggers associated with mental health/substance abuse issues, and Increase skills for wellness and recovery ? ?Therapeutic Interventions: Assess for all discharge needs, 1 to 1 time with Education officer, museum, Explore available resources and support systems, Assess for adequacy in community support network, Educate family and significant other(s) on suicide prevention, Complete Psychosocial Assessment, Interpersonal group therapy. ? ?Evaluation of Outcomes: Not Met ? ? ?Progress in Treatment: ?Attending groups: No. ?Participating in groups: No. ?Taking medication as prescribed: Yes. ?Toleration medication: Yes. ?Family/Significant other contact made: Yes, individual(s) contacted:  Mother  ?Patient understands diagnosis: No. ?Discussing patient identified problems/goals with staff: Yes. ?Medical problems stabilized or resolved: Yes. ?Denies suicidal/homicidal ideation: Yes. ?Issues/concerns per patient self-inventory: No. ? ? ?New  problem(s) identified: No, Describe:  None  ? ?New Short Term/Long Term Goal(s): medication stabilization, elimination of SI thoughts, development of comprehensive mental wellness plan. ? ?Patient Goals: "To change my thoughts from negative to positive"  ? ?Discharge Plan or Barriers: Patient recently admitted. CSW will continue to follow and assess for appropriate referrals and possible discharge planning. ? ?Reason for Continuation of Hospitalization: Anxiety ?Depression ?Medication stabilization ?Suicidal ideation ? ?Estimated Length of Stay: 3 to 5 days  ? ? ?Scribe for Treatment Team: ?Darleen Crocker, Latanya Presser ?10/05/2021 ?1:22 PM ?

## 2021-10-06 LAB — RAPID URINE DRUG SCREEN, HOSP PERFORMED
Amphetamines: NOT DETECTED
Barbiturates: NOT DETECTED
Benzodiazepines: NOT DETECTED
Cocaine: NOT DETECTED
Opiates: NOT DETECTED
Tetrahydrocannabinol: POSITIVE — AB

## 2021-10-06 LAB — HEMOGLOBIN A1C
Hgb A1c MFr Bld: 5.3 % (ref 4.8–5.6)
Mean Plasma Glucose: 105 mg/dL

## 2021-10-06 MED ORDER — QUETIAPINE FUMARATE 50 MG PO TABS
50.0000 mg | ORAL_TABLET | Freq: Every day | ORAL | Status: DC
  Administered 2021-10-06: 50 mg via ORAL
  Filled 2021-10-06 (×3): qty 1

## 2021-10-06 NOTE — [facility] Suicide Risk Assessment (Signed)
[facility] INPATIENT:  Family/Significant Other Suicide Prevention Education ? ?Suicide Prevention Education:  ?Contact Attempts: mother [name] [phone]), has been identified by the patient as the family member/significant other with whom the patient will be residing, and identified as the person(s) who will aid the patient in the event of a mental health crisis.  With written consent from the patient, two attempts were made to provide suicide prevention education, prior to and/or following the patient's discharge.  We were unsuccessful in providing suicide prevention education.  A suicide education pamphlet was given to the patient to share with family/significant other. ? ?Date and time of first attempt:10/06/2021 / 1:27pm CSW left HIPAA compliant voicemail requesting a return call.  ?Date and time of second attempt: Second attempt still needs to be made. ? ? ?Lulu Hirschmann A Alvan Culpepper ?10/06/2021, 1:27 PM ?

## 2021-10-06 NOTE — Progress Notes (Signed)
?   10/06/21 1800  ?Psych Admission Type (Psych Patients Only)  ?Admission Status Voluntary  ?Psychosocial Assessment  ?Patient Complaints None  ?Eye Contact Fair  ?Facial Expression Flat  ?Affect Blunted  ?Speech Logical/coherent  ?Interaction Assertive  ?Motor Activity Slow  ?Appearance/Hygiene Unremarkable  ?Behavior Characteristics Cooperative;Appropriate to situation  ?Mood Pleasant  ?Thought Process  ?Coherency WDL  ?Content Blaming others  ?Delusions None reported or observed  ?Perception WDL  ?Hallucination None reported or observed  ?Judgment WDL  ?Confusion None  ?Danger to Self  ?Current suicidal ideation? Denies  ?Danger to Others  ?Danger to Others None reported or observed  ? ?D: Patient denies SI/HI/AVH. Pt denies anxiety and depression. Pt attended  groups and was out in open areas. Pt. Was moved to 300 hall to make room a pt. That needed the 500 unit. ?A:  Pt. Took all medicine and had nicorette for cravings.\ ?R: Safety maintained ?

## 2021-10-06 NOTE — Group Note (Signed)
Date:  10/06/2021 ?Time:  9:29 AM ? ?Group Topic/Focus:  ?Goals Group:   The focus of this group is to help patients establish daily goals to achieve during treatment and discuss how the patient can incorporate goal setting into their daily lives to aide in recovery. ?Orientation:   The focus of this group is to educate the patient on the purpose and policies of crisis stabilization and provide a format to answer questions about their admission.  The group details unit policies and expectations of patients while admitted. ? ? ? ?Participation Level:  Did Not Attend ? ?Participation Quality:   ? ?Affect:   ? ?Cognitive:   ? ?Insight:  ? ?Engagement in Group:   ? ?Modes of Intervention:   ? ?Additional Comments:   ? ?Jaquita Rector ?10/06/2021, 9:29 AM ? ?

## 2021-10-06 NOTE — Group Note (Signed)
Recreation Therapy Group Note ? ? ?Group Topic:Team Building  ?Group Date: 10/06/2021 ?Start Time: 79 ?End Time: 1050 ?Facilitators: Caroll Rancher, LRT,CTRS ?Location: 500 Hall Dayroom ? ? ?Goal Area(s) Addresses:  ?Patient will effectively work with peer towards shared goal.  ?Patient will identify skills used to make activity successful.  ?Patient will identify how skills used during activity can be applied to reach post d/c goals.  ? ?Group Description:Tallest [employer]. In teams of 5-6, patients were given 11 craft pipe cleaners. Using the materials provided, patients were instructed to compete again the opposing team(s) to build the tallest free-standing structure from floor level. The activity was timed; difficulty increased by Clinical research associate as Production designer, theatre/television/film continued.  Systematically resources were removed with additional directions for example, placing one arm behind their back, working in silence, and shape stipulations. LRT facilitated post-activity discussion reviewing team processes and necessary communication skills involved in completion. Patients were encouraged to reflect how the skills utilized, or not utilized, in this activity can be incorporated to positively impact support systems post discharge. ? ? ?Affect/Mood: Bright ?  ?Participation Level: Engaged ?  ?Participation Quality: Independent ?  ?Behavior: Appropriate and Interactive  ?  ?Speech/Thought Process: Focused ?  ?Insight: Good ?  ?Judgement: Good ?  ?Modes of Intervention: STEM Activity ?  ?Patient Response to Interventions:  Engaged ?  ?Education Outcome: ? Acknowledges education and In group clarification offered   ? ?Clinical Observations/Individualized Feedback: Pt was bright, engaged and social with peers.  Pt also shared ideas with peers on what he felt would work and also listened to the suggestions of his peers.  Pt expressed the hardest part was making the structure stable and not letting it fall apart.  The easiest  part according to pt was figuring out a game plan.  Pt went on to say when working with support system, he has to learn not to hold things in.  ? ? ?Plan: Continue to engage patient in RT group sessions 2-3x/week. ? ? ?Caroll Rancher, LRT,CTRS ?10/06/2021 11:19 AM ?

## 2021-10-06 NOTE — [facility] Group Notes (Signed)
PsychoEducational Group Note- ?Patients were educated on positive reframing and how negative thinking patterns impact our mental health. Patients were read a poem '' The pencil Analogy '' Patients were encouraged to practice this thinking to impact mental health and then asked what negative thought they would like to let go of. Pt shared that he would like to let go of his negative cycle of thinking and that he needs to look at things in a more positive way. ?

## 2021-10-06 NOTE — Hospital Course (Addendum)
William [name] is a 21 year old patient with a PPH of MDD, recurrent severe w/o psychosis and no pertinent PMH who presented to be [facility] as a walk-in endorsing SI and worsening mood. ? ?During the patient's hospitalization, patient had extensive initial psychiatric evaluation, and follow-up psychiatric evaluations every day. ? ?Psychiatric diagnoses provided upon initial assessment:  ? 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 ?Patient's psychiatric medications were adjusted on admission: - - Start Prozac 10mg , depressed mood, PTSD, and mild OCD ?- Start Trazodone 50mg  QHS PRN ?- Start Hydroxyzine 25mg  TID PRN ? ?During the hospitalization, other adjustments were made to the patient's psychiatric medication regimen:  ?- Prozac slowly increased to 40mg  daily ?- Patient started on Seroquel 100mg  QHS for OCD and insomnia ?- patient started on Propanolol 10mg  BID for tachycardia and anxiety ? ?Gradually, patient started adjusting to milieu.   ?Patient's care was discussed during the interdisciplinary team meeting every day during the hospitalization. ? ?The patient denied having side effects to prescribed psychiatric medication. ? ?The patient reports their target psychiatric symptoms of depressed mood, ruminative thoughts, and intrusive thoughts responded well to the psychiatric medications, and the patient reports overall benefit other psychiatric hospitalization. Supportive psychotherapy was provided to the patient. The patient also participated in regular group therapy while admitted.  ? ?Labs were reviewed with the patient, and abnormal results were discussed with the patient. ? ?The patient denied having suicidal thoughts more than 48 hours prior to discharge.  Patient denies having homicidal thoughts.  Patient denies having auditory hallucinations.  Patient denies any visual hallucinations.   Patient denies having paranoid thoughts. ? ?The patient is able to verbalize their individual safety plan to this provider. ? ?It is recommended to the patient to continue psychiatric medications as prescribed, after discharge from the hospital.   ? ?It is recommended to the patient to follow up with your outpatient psychiatric provider and PCP. ? ?Discussed with the patient, the impact of alcohol, drugs, tobacco have been there overall psychiatric and medical wellbeing, and total abstinence from substance use was recommended the patient.  ? ? ?Activity: as tolerated ? ?Diet: heart healthy ? ?Other: ?-Follow-up with your outpatient psychiatric provider -instructions on appointment date, time, and address (location) are provided to you in discharge paperwork. ? ?-Take your psychiatric medications as prescribed at discharge - instructions are provided to you in the discharge paperwork ? ?-Follow-up with outpatient primary care doctor and other specialists ? ?-Testing: Follow-up with outpatient provider for abnormal lab results: Hyperbilirubinemia, elevated Total Bilirubin: 1.5 (chronic abnormality) ? ?-Recommend abstinence from alcohol, tobacco, and other illicit drug use at discharge.  ? ?-If your psychiatric symptoms recur, worsen, or if you have side effects to your psychiatric medications, call your outpatient psychiatric provider, [phone] or go to the nearest emergency department. ? ?-If suicidal thoughts recur, call your outpatient psychiatric provider, 911, [phone], or go to [facility] or the nearest emergency department.  ?

## 2021-10-06 NOTE — Progress Notes (Signed)
[facility] MD Progress Note ? ?10/06/2021 12:55 PM ?William [name]  ?MRN:  [id number] ?Subjective:  William [name] is a 21 year old patient with a PPH of MDD, recurrent severe w/o psychosis and no pertinent PMH who presented to be [facility] as a walk-in endorsing SI and worsening mood. ? ?On assessment this AM patient reports that he is doing better than yesterday. Patient reports that his mood this AM was "a little irritable but better than yesterday." Patient reports that he had some trouble falling asleep and endorses that this was 2/2 to his rumination about "things that happened in the past." Patient reports that he did respond well to the Trazodone. Patient endorses that he Is eating well and does not endorse any adverse side effects to the medications. Patient reports that he a good conversation with his mother and that he also had a good conversation with police yesterday. Patient reports that he had been hesitant in the past to report what had happened to him, but he did not want what happened to him to continue happening. Patient reports that he is happy he spoke with police. Patient reports that he is still ruminating on thoughts of things that "happened in the past" this AM, but he feels that he must learn how to "get over this stuff."  Patient endorses that these are thoughts mostly about the male he had some level of relationship last year. Patient reports that he knows he needs to let these things go, and he does not like having these thoughts, but it is difficult for him to get rid of these thoughts. Patient reports that he has also been feelin glike he is not "in reality" but a difficult time explaining what he means by this. Patient reports that he thinks this may have something to do with him being sober for 9 days.   Patient reports that the thoughts still make him angry at times. Patient denies SI, HI and AVH this AM. Patient also denies urges to cut.  ? ?Principal Problem: MDD (major depressive disorder),  recurrent episode, severe ([place]) ?Diagnosis: Principal Problem: ?  MDD (major depressive disorder), recurrent episode, severe ([place]) ? ?Total Time spent with patient: 30 minutes ? ?Past Psychiatric History: See H&P ? ?Past Medical History:  ?Past Medical History:  ?Diagnosis Date  ? Anxiety   ? Hypertension   ? Migraine   ? History reviewed. No pertinent surgical history. ?Family History: History reviewed. No pertinent family history. ?Family Psychiatric  History:  See H&P  ?Social History:  ?Social History  ? ?Substance and Sexual Activity  ?Alcohol Use Yes  ? Comment: "a few days out of the week"  ?   ?Social History  ? ?Substance and Sexual Activity  ?Drug Use Yes  ? Types: Marijuana  ? Comment: daily, also mushrooms  ?  ?Social History  ? ?Socioeconomic History  ? Marital status: Single  ?  Spouse name: Not on file  ? Number of children: Not on file  ? Years of education: Not on file  ? Highest education level: Not on file  ?Occupational History  ? Not on file  ?Tobacco Use  ? Smoking status: Some Days  ?  Types: Cigarettes  ? Smokeless tobacco: Never  ?Vaping Use  ? Vaping Use: Every day  ?Substance and Sexual Activity  ? Alcohol use: Yes  ?  Comment: "a few days out of the week"  ? Drug use: Yes  ?  Types: Marijuana  ?  Comment: daily, also mushrooms  ?  Sexual activity: Not on file  ?Other Topics Concern  ? Not on file  ?Social History Narrative  ? Not on file  ? ?Social Determinants of Health  ? ?Financial Resource Strain: Not on file  ?Food Insecurity: Not on file  ?Transportation Needs: Not on file  ?Physical Activity: Not on file  ?Stress: Not on file  ?Social Connections: Not on file  ? ?Additional Social History:  ?  ?  ?  ?  ?  ?  ?  ?  ?  ?  ?  ? ?Sleep: Fair ? ?Appetite:  Good ? ?Current Medications: ?Current Facility-Administered Medications  ?Medication Dose Route Frequency Provider Last Rate Last Admin  ? acetaminophen (TYLENOL) tablet 650 mg  650 mg Oral Q6H PRN Derrill Center, NP      ? alum & mag  hydroxide-simeth (MAALOX/MYLANTA) 200-200-20 MG/5ML suspension 30 mL  30 mL Oral Q4H PRN Derrill Center, NP      ? FLUoxetine (PROZAC) capsule 20 mg  20 mg Oral Daily Damita Dunnings B, MD   20 mg at 10/06/21 [id number]  ? hydrOXYzine (ATARAX) tablet 25 mg  25 mg Oral TID PRN Derrill Center, NP   25 mg at 10/06/21 0032  ? OLANZapine zydis (ZYPREXA) disintegrating tablet 5 mg  5 mg Oral Q8H PRN Massengill, Ovid Curd, MD      ? And  ? LORazepam (ATIVAN) tablet 1 mg  1 mg Oral PRN Massengill, Ovid Curd, MD      ? And  ? ziprasidone (GEODON) injection 20 mg  20 mg Intramuscular PRN Massengill, Ovid Curd, MD      ? magnesium hydroxide (MILK OF MAGNESIA) suspension 30 mL  30 mL Oral Daily PRN Derrill Center, NP      ? nicotine polacrilex (NICORETTE) gum 2 mg  2 mg Oral PRN Prescilla Sours, PA-C   2 mg at 10/06/21 [id number]  ? traZODone (DESYREL) tablet 50 mg  50 mg Oral QHS PRN Derrill Center, NP   50 mg at 10/05/21 2147  ? ? ?Lab Results:  ?Results for orders placed or performed during the hospital encounter of 10/04/21 (from the past 48 hour(s))  ?CBC     Status: None  ? Collection Time: 10/05/21  6:40 AM  ?Result Value Ref Range  ? WBC 5.6 4.0 - 10.5 K/uL  ? RBC 5.17 4.22 - 5.81 MIL/uL  ? Hemoglobin 15.9 13.0 - 17.0 g/dL  ? HCT 46.1 39.0 - 52.0 %  ? MCV 89.2 80.0 - 100.0 fL  ? MCH 30.8 26.0 - 34.0 pg  ? MCHC 34.5 30.0 - 36.0 g/dL  ? RDW 12.7 11.5 - 15.5 %  ? Platelets 216 150 - 400 K/uL  ? nRBC 0.0 0.0 - 0.2 %  ?  Comment: Performed at [facility], [place] [address] [place], [place] [address]  ?Comprehensive metabolic panel     Status: Abnormal  ? Collection Time: 10/05/21  6:40 AM  ?Result Value Ref Range  ? Sodium 139 135 - 145 mmol/L  ? Potassium 3.8 3.5 - 5.1 mmol/L  ? Chloride 107 98 - 111 mmol/L  ? CO2 26 22 - 32 mmol/L  ? Glucose, Bld 92 70 - 99 mg/dL  ?  Comment: Glucose reference range applies only to samples taken after fasting for at least 8 hours.  ? BUN 12 6 - 20 mg/dL  ? Creatinine, Ser 0.81 0.61 - 1.24  mg/dL  ? Calcium 9.3 8.9 - 10.3 mg/dL  ?  Total Protein 6.6 6.5 - 8.1 g/dL  ? Albumin 4.3 3.5 - 5.0 g/dL  ? AST 15 15 - 41 U/L  ? ALT 25 0 - 44 U/L  ? Alkaline Phosphatase 54 38 - 126 U/L  ? Total Bilirubin 1.5 (H) 0.3 - 1.2 mg/dL  ? GFR, Estimated >60 >60 mL/min  ?  Comment: (NOTE) ?Calculated using the CKD-EPI Creatinine Equation (2021) ?  ? Anion gap 6 5 - 15  ?  Comment: Performed at [facility], [place] [address] [place], [place] [address]  ?Lipid panel     Status: Abnormal  ? Collection Time: 10/05/21  6:40 AM  ?Result Value Ref Range  ? Cholesterol 124 0 - 200 mg/dL  ? Triglycerides 68 <150 mg/dL  ? HDL 35 (L) >40 mg/dL  ? Total CHOL/HDL Ratio 3.5 RATIO  ? VLDL 14 0 - 40 mg/dL  ? LDL Cholesterol 75 0 - 99 mg/dL  ?  Comment:        ?Total Cholesterol/HDL:CHD Risk ?Coronary Heart Disease Risk Table ?                    Men   Women ? 1/2 Average Risk   3.4   3.3 ? Average Risk       5.0   4.4 ? 2 X Average Risk   9.6   7.1 ? 3 X Average Risk  23.4   11.0 ?       ?Use the calculated Patient Ratio ?above and the CHD Risk Table ?to determine the patient's CHD Risk. ?       ?ATP III CLASSIFICATION (LDL): ? <100     mg/dL   Optimal ? 100-129  mg/dL   Near or Above ?                   Optimal ? 130-159  mg/dL   Borderline ? 160-189  mg/dL   High ? >190     mg/dL   Very High ?Performed at [facility], [place] [address], [place], [place] [address] ?  ?TSH     Status: None  ? Collection Time: 10/05/21  6:40 AM  ?Result Value Ref Range  ? TSH 2.314 0.350 - 4.500 uIU/mL  ?  Comment: Performed by a 3rd Generation assay with a functional sensitivity of <=0.01 uIU/mL. ?Performed at [facility], [place] [address], [place], [place] [address] ?  ?Hemoglobin A1c     Status: None  ? Collection Time: 10/05/21  6:40 AM  ?Result Value Ref Range  ? Hgb A1c MFr Bld 5.3 4.8 - 5.6 %  ?  Comment: (NOTE) ?        Prediabetes: 5.7 - 6.4 ?        Diabetes: >6.4 ?        Glycemic control for  adults with diabetes: <7.0 ?  ? Mean Plasma Glucose 105 mg/dL  ?  Comment: (NOTE) ?Performed At: [place] ?[address] [place], [place] [id number] ?Rush Farmer MD [id number] ?  ? ? ?Bl

## 2021-10-07 MED ORDER — FLUOXETINE HCL 20 MG PO CAPS
30.0000 mg | ORAL_CAPSULE | Freq: Every day | ORAL | Status: AC
  Administered 2021-10-08: 30 mg via ORAL
  Filled 2021-10-07: qty 1

## 2021-10-07 MED ORDER — FLUOXETINE HCL 20 MG PO CAPS
40.0000 mg | ORAL_CAPSULE | Freq: Every day | ORAL | Status: DC
  Administered 2021-10-09 – 2021-10-11 (×3): 40 mg via ORAL
  Filled 2021-10-07 (×4): qty 2

## 2021-10-07 MED ORDER — QUETIAPINE FUMARATE 100 MG PO TABS
100.0000 mg | ORAL_TABLET | Freq: Every day | ORAL | Status: DC
  Administered 2021-10-07 – 2021-10-10 (×4): 100 mg via ORAL
  Filled 2021-10-07 (×6): qty 1

## 2021-10-07 NOTE — Progress Notes (Signed)
[facility] MD Progress Note ? ?10/07/2021 4:34 PM ?[name]  ?MRN:  [id number] ?Subjective:   William [name] is a 21 year old patient with a PPH of MDD, recurrent severe w/o psychosis and no pertinent PMH who presented to be [facility] as a walk-in endorsing SI and worsening mood. ? ?On assessment this AM patient reports that he is doing better than yesterday. Patient reports that his mood this AM was "a little irritable but better than yesterday." Patient reports that he had some trouble falling asleep and endorses that this was 2/2 to his rumination about "things that happened in the past." Patient reports that he did respond well to the Trazodone. Patient endorses that he Is eating well and does not endorse any adverse side effects to the medications. Patient reports that he a good conversation with his mother and that he also had a good conversation with police yesterday. Patient reports that he had been hesitant in the past to report what had happened to him, but he did not want what happened to him to continue happening. Patient reports that he is happy he spoke with police. Patient reports that he is still ruminating on thoughts of things that "happened in the past" this AM, but he feels that he must learn how to "get over this stuff." Patient reports that the thoughts still make him angry at times. Patient denies SI, HI and AVH this AM. Patient also denies urges to cut.  ? ?Spend a significant amount of time talking to patient about boundary setting and values.  Patient endorses he would spend the weekend working assignment and thinking about this. ? ?Principal Problem: MDD (major depressive disorder), recurrent episode, severe ([place]) ?Diagnosis: Principal Problem: ?  MDD (major depressive disorder), recurrent episode, severe ([place]) ? ?Total Time spent with patient: 20 minutes ? ?Past Psychiatric History: See H&P ? ?Past Medical History:  ?Past Medical History:  ?Diagnosis Date  ? Anxiety   ? Hypertension   ? Migraine   ?  History reviewed. No pertinent surgical history. ?Family History: History reviewed. No pertinent family history. ?Family Psychiatric  History: See H&P ?Social History:  ?Social History  ? ?Substance and Sexual Activity  ?Alcohol Use Yes  ? Comment: "a few days out of the week"  ?   ?Social History  ? ?Substance and Sexual Activity  ?Drug Use Yes  ? Types: Marijuana  ? Comment: daily, also mushrooms  ?  ?Social History  ? ?Socioeconomic History  ? Marital status: Single  ?  Spouse name: Not on file  ? Number of children: Not on file  ? Years of education: Not on file  ? Highest education level: Not on file  ?Occupational History  ? Not on file  ?Tobacco Use  ? Smoking status: Some Days  ?  Types: Cigarettes  ? Smokeless tobacco: Never  ?Vaping Use  ? Vaping Use: Every day  ?Substance and Sexual Activity  ? Alcohol use: Yes  ?  Comment: "a few days out of the week"  ? Drug use: Yes  ?  Types: Marijuana  ?  Comment: daily, also mushrooms  ? Sexual activity: Not on file  ?Other Topics Concern  ? Not on file  ?Social History Narrative  ? Not on file  ? ?Social Determinants of Health  ? ?Financial Resource Strain: Not on file  ?Food Insecurity: Not on file  ?Transportation Needs: Not on file  ?Physical Activity: Not on file  ?Stress: Not on file  ?Social Connections: Not on  file  ? ?Additional Social History:  ?  ?  ?  ?  ?  ?  ?  ?  ?  ?  ?  ? ?Sleep: Good ? ?Appetite:  Good ? ?Current Medications: ?Current Facility-Administered Medications  ?Medication Dose Route Frequency Provider Last Rate Last Admin  ? acetaminophen (TYLENOL) tablet 650 mg  650 mg Oral Q6H PRN Derrill Center, NP      ? alum & mag hydroxide-simeth (MAALOX/MYLANTA) 200-200-20 MG/5ML suspension 30 mL  30 mL Oral Q4H PRN Derrill Center, NP      ? [START ON 10/08/2021] FLUoxetine (PROZAC) capsule 30 mg  30 mg Oral Daily Damita Dunnings B, MD      ? Derrill Memo ON 10/09/2021] FLUoxetine (PROZAC) capsule 40 mg  40 mg Oral Daily Damita Dunnings B, MD      ? hydrOXYzine  (ATARAX) tablet 25 mg  25 mg Oral TID PRN Derrill Center, NP   25 mg at 10/06/21 0032  ? OLANZapine zydis (ZYPREXA) disintegrating tablet 5 mg  5 mg Oral Q8H PRN Massengill, Ovid Curd, MD      ? And  ? LORazepam (ATIVAN) tablet 1 mg  1 mg Oral PRN Massengill, Ovid Curd, MD      ? And  ? ziprasidone (GEODON) injection 20 mg  20 mg Intramuscular PRN Massengill, Ovid Curd, MD      ? magnesium hydroxide (MILK OF MAGNESIA) suspension 30 mL  30 mL Oral Daily PRN Derrill Center, NP      ? nicotine polacrilex (NICORETTE) gum 2 mg  2 mg Oral PRN Prescilla Sours, PA-C   2 mg at 10/07/21 [id number]  ? QUEtiapine (SEROQUEL) tablet 100 mg  100 mg Oral QHS Damita Dunnings B, MD      ? traZODone (DESYREL) tablet 50 mg  50 mg Oral QHS PRN Derrill Center, NP   50 mg at 10/06/21 2104  ? ? ?Lab Results: No results found for this or any previous visit (from the past 48 hour(s)). ? ?Blood Alcohol level:  ?Lab Results  ?Component Value Date  ? ETH <10 09/18/2021  ? ETH <10 03/24/2021  ? ? ?Metabolic Disorder Labs: ?Lab Results  ?Component Value Date  ? HGBA1C 5.3 10/05/2021  ? MPG 105 10/05/2021  ? MPG 96.8 03/27/2021  ? ?No results found for: PROLACTIN ?Lab Results  ?Component Value Date  ? CHOL 124 10/05/2021  ? TRIG 68 10/05/2021  ? HDL 35 (L) 10/05/2021  ? CHOLHDL 3.5 10/05/2021  ? VLDL 14 10/05/2021  ? [place] 75 10/05/2021  ? [place] 87 03/27/2021  ? ? ?Physical Findings: ?AIMS:  , ,  ,  ,    ?CIWA:    ?COWS:    ? ?Musculoskeletal: ?Strength & Muscle Tone: within normal limits ?Gait & Station: normal ?Patient leans: N/A ? ?Psychiatric Specialty Exam: ? ?Presentation  ?General Appearance: Appropriate for Environment; Casual ? ?Eye Contact:Good ? ?Speech:Clear and Coherent ? ?Speech Volume:Normal ? ?Handedness:Right ? ? ?Mood and Affect  ?Mood:Anxious ? ?Affect:Appropriate; Congruent ? ? ?Thought Process  ?Thought Processes:Goal Directed ? ?Descriptions of Associations:Circumstantial ? ?Orientation:Full (Time, Place and Person) ? ?Thought  Content:Rumination; Logical ? ?History of Schizophrenia/Schizoaffective disorder:No ? ?Duration of Psychotic Symptoms:No data recorded ?Hallucinations:Hallucinations: None ? ?Ideas of Reference:None ? ?Suicidal Thoughts:Suicidal Thoughts: No ? ?Homicidal Thoughts:Homicidal Thoughts: No ? ? ?Sensorium  ?Memory:Immediate Good; Recent Good ? ?Judgment:Fair ? ?Insight:Shallow ? ? ?Executive Functions  ?Concentration:Fair ? ?Attention Span:Fair ? ?Recall:Good ? ?[place] ? ?Language:Good ? ? ?  Psychomotor Activity  ?Psychomotor Activity:Psychomotor Activity: Normal ? ? ?Assets  ?Assets:Resilience; Desire for Improvement; Communication Skills; Social Support; Housing ? ? ?Sleep  ?Sleep:Sleep: Good ? ? ? ?Physical Exam: ?Physical Exam ?Constitutional:   ?   Appearance: Normal appearance.  ?HENT:  ?   Head: Normocephalic and atraumatic.  ?Pulmonary:  ?   Effort: Pulmonary effort is normal.  ?Neurological:  ?   Mental Status: He is alert and oriented to person, place, and time.  ? ?Review of Systems  ?Psychiatric/Behavioral:  Negative for suicidal ideas. The patient is not nervous/anxious.   ?Blood pressure 132/88, pulse 86, temperature (!) 96.7 ?F (35.9 ?C), resp. rate 18, height 6' (1.829 m), weight 95.3 kg, SpO2 98 %. Body mass index is 28.48 kg/m?. ? ? ?Treatment Plan Summary: ?Daily contact with patient to assess and evaluate symptoms and progress in treatment and Medication management ? ?Patient continues to ruminate on [place] but is not denying intrusive thoughts.  Patient insight and judgment are slowly improving.  Patient was very receptive to talking about setting boundaries and focusing on his values to be less easily manipulated by others.  Patient does not endorse having any negative side effects of his medication, but based on continued rumination patient would benefit from increase both his Prozac and Seroquel. ? ?  ?MDD, recurrent, severe w/o psychosis ?PTSD ?OCD, mild ?-Increase Prozac to 30mg   daily and then 40 mg 4/9, increase as tolerated ?-Increase Seroquel to 100mg  QHS; insomnia, rumination, and intrusive thoughts ?  ?PRN ?-Tylenol 650mg  q6h, pain ?-Maalox 74ml q4h, indigestion ?-Atarax 25mg  TID,

## 2021-10-07 NOTE — [facility] Suicide Risk Assessment (Signed)
[facility] INPATIENT:  Family/Significant Other Suicide Prevention Education ? ?Suicide Prevention Education:  ?Contact Attempts: mother [name] [phone]), has been identified by the patient as the family member/significant other with whom the patient will be residing, and identified as the person(s) who will aid the patient in the event of a mental health crisis.  With written consent from the patient, two attempts were made to provide suicide prevention education, prior to and/or following the patient's discharge.  We were unsuccessful in providing suicide prevention education.  A suicide education pamphlet was given to the patient to share with family/significant other. ? ?Date and time of first attempt:10/06/2021 / 1:27pm CSW left HIPAA compliant voicemail requesting a return call.  ?Date and time of second attempt: 10/07/2021 / 1:18pm CSW left HIPAA compliant voicemail. ?  ?Unable to reach supports, SPE pamphlet placed on chart for patient to share with supports at discharge.   ? ? ?William [name] A William [name] ?10/07/2021, 1:19 PM ?

## 2021-10-07 NOTE — Progress Notes (Signed)
Patient is pleasant and polite on approach, with a bright facial expression this morning. Patient denies SI, HI, AVH. Patient states " The medication I'm taking here is calming me down." Patient is complaint with scheduled medication, tolerated  them well with no side effect noted. Patient denies any pain or discomfort. No acute distress noted at this time, Q 15 minutes safety observation in place. Staff will continue to monitor. ? 10/07/21 9678  ?Psych Admission Type (Psych Patients Only)  ?Admission Status Voluntary  ?Psychosocial Assessment  ?Patient Complaints Anxiety  ?Eye Contact Fair  ?Facial Expression Anxious  ?Affect Appropriate to circumstance  ?Speech Logical/coherent  ?Interaction Assertive  ?Motor Activity Other (Comment) ?(Steady)  ?Appearance/Hygiene Unremarkable  ?Behavior Characteristics Appropriate to situation  ?Mood Anxious;Pleasant  ?Thought Process  ?Coherency WDL  ?Content WDL  ?Delusions None reported or observed  ?Perception WDL  ?Hallucination None reported or observed  ?Judgment WDL  ?Confusion None  ?Danger to Self  ?Current suicidal ideation? Denies  ?Self-Injurious Behavior No self-injurious ideation or behavior indicators observed or expressed   ?Agreement Not to Harm Self Yes  ?Description of Agreement verbal contract  ?Danger to Others  ?Danger to Others None reported or observed  ? ? ?

## 2021-10-07 NOTE — Group Note (Signed)
LCSW Group Therapy Note ? ? ?Group Date: 10/07/2021 ?Start Time: 1300 ?End Time: 1400 ? ?Type of Therapy and Topic:  Group Therapy: Anger Management   ?  ?Participation Level:  Active ?  ?Description of Group:   Due to the acuity on the unit, staff recommended no close contact at this time so group was not held.  Patient was provided with written information and worksheets about Anger Management.  CSW reviewed the packet with the Pt and answered all questions.  ? ?Terryn Rosenkranz M Arletha Marschke, LCSWA ?10/07/2021  10:51 AM   ? ?

## 2021-10-07 NOTE — Plan of Care (Signed)
  Problem: Coping: Goal: Ability to verbalize frustrations and anger appropriately will improve Outcome: Progressing Goal: Ability to demonstrate self-control will improve Outcome: Progressing   Problem: Safety: Goal: Periods of time without injury will increase Outcome: Progressing   

## 2021-10-07 NOTE — Group Note (Signed)
Recreation Therapy Group Note ? ? ?Group Topic:Stress Management  ?Group Date: 10/07/2021 ?Start Time: 1110 ?End Time: 1120 ?Facilitators: Michaela Shankel, LRT,CTRS ?Location:  [address] ? ? ?Activity Description: LRT provided pt a workbook reviewing stress management concepts, identifying triggers to stress and identifying things in and out of their control. Pt given the option to complete the packet in their room at their own pace.  ? ? ?Participation Quality: Independent ?  ? ?Clinical Observations/Individualized Feedback:  Due to illness on unit, packets were given dealing with stress management for patients to complete.  ?  ? ?Plan: Continue to engage patient in RT group sessions 2-3x/week. ? ? ?Jazzmyn Filion, LRT,CTRS ?10/07/2021 12:01 PM ?

## 2021-10-07 NOTE — Progress Notes (Signed)
?   10/06/21 2105  ?Psych Admission Type (Psych Patients Only)  ?Admission Status Voluntary  ?Psychosocial Assessment  ?Patient Complaints Anxiety  ?Eye Contact Fair  ?Facial Expression Anxious  ?Affect Appropriate to circumstance  ?Speech Logical/coherent  ?Interaction Assertive  ?Motor Activity Other (Comment) ?(WDL)  ?Appearance/Hygiene Unremarkable  ?Behavior Characteristics Cooperative;Appropriate to situation  ?Mood Anxious;Pleasant  ?Thought Process  ?Coherency WDL  ?Content Blaming others  ?Delusions None reported or observed  ?Perception WDL  ?Hallucination None reported or observed  ?Judgment WDL  ?Confusion None  ?Danger to Self  ?Current suicidal ideation? Denies  ?Self-Injurious Behavior No self-injurious ideation or behavior indicators observed or expressed   ?Agreement Not to Harm Self Yes  ?Description of Agreement Verbal contract  ?Danger to Others  ?Danger to Others None reported or observed  ? ? ?

## 2021-10-08 NOTE — Progress Notes (Signed)
D. Pt presented as mildly anxious, but friendly upon initial approach- rated his depression, hopelessness and anxiety a 3/0/2, respectively. Pt reported that he slept well last night, and endorsed having a good appetite, good concentration, and rated his energy level as high.  Pt reported that his goal today was "to stay strong and keep a positive mindset by staying focused and keeping a good mindset." Pt observed interacting well with peers and staff. Pt currently denies SI/HI and AVH  ?A. Labs and vitals monitored. Pt given and educated on medications. Pt supported emotionally and encouraged to express concerns and ask questions.   ?R. Pt remains safe with 15 minute checks. Will continue POC. ? ?  ?

## 2021-10-08 NOTE — Progress Notes (Signed)
[facility] MD Progress Note ? ?10/08/2021 4:15 PM ?William [name]  ?MRN:  [id number] ? ?Subjective:  "I have positive energy and feel good today, without any sadness." ? ?Brief History:William [name] is a 21 year old patient with a PPH of MDD, recurrent severe w/o psychosis and no pertinent PMH who presented to be [facility] as a walk-in endorsing SI and worsening mood. ?  ?Daily Notes 10/08/21: On assessment today patient is examined sitting on the chair in the office. Chart is reviewed and findings shared with the treatment team and discussed with Dr. Loleta Chance. He is calm and responsive appropriately to questions. Good eye contact on encounter. Speech is fluent and with normal pattern and volume.  Alert and oriented to person, place, time and situation. Thought process is coherent and linear and thought content is logical and within normal limit.  Good concentration and attention span.  ?  ?Patient reported happy and good mood. Endorsed great appetite and consuming 100% of meals. Admitted to sleeping at least 6 hours last night. Denied Suicide ideation, homicidal ideation, auditory or visual hallucination. Denied medication side effects and no craving for cigarettes and substance use at this time. Compliant with all medications at this time. Actively attending group milieu and communicating with peers on the hall way. ? ? ?Principal Problem: MDD (major depressive disorder), recurrent episode, severe (HCC) ? ?Diagnosis: Principal Problem: ?  MDD (major depressive disorder), recurrent episode, severe (HCC) ? ?Total Time spent with patient: 20 minutes ? ?Past Psychiatric History: [facility] 03/2021- MDD w/o psychosis dc'd on Prozac 40mg  and Seroquel 50mg . Patient has not been compliant since discharge. ?  ? ?Past Medical History:  ?Past Medical History:  ?Diagnosis Date  ? Anxiety   ? Hypertension   ? Migraine   ? History reviewed. No pertinent surgical history. ? ?Family History: History reviewed. No pertinent family history. ? ?Family Psychiatric   History:  Mom: Depression and anxiety ?Dad: Etoh use disorder in past ? ?Social History:  ?Social History  ? ?Substance and Sexual Activity  ?Alcohol Use Yes  ? Comment: "a few days out of the week"  ?   ?Social History  ? ?Substance and Sexual Activity  ?Drug Use Yes  ? Types: Marijuana  ? Comment: daily, also mushrooms  ?  ?Social History  ? ?Socioeconomic History  ? Marital status: Single  ?  Spouse name: Not on file  ? Number of children: Not on file  ? Years of education: Not on file  ? Highest education level: Not on file  ?Occupational History  ? Not on file  ?Tobacco Use  ? Smoking status: Some Days  ?  Types: Cigarettes  ? Smokeless tobacco: Never  ?Vaping Use  ? Vaping Use: Every day  ?Substance and Sexual Activity  ? Alcohol use: Yes  ?  Comment: "a few days out of the week"  ? Drug use: Yes  ?  Types: Marijuana  ?  Comment: daily, also mushrooms  ? Sexual activity: Not on file  ?Other Topics Concern  ? Not on file  ?Social History Narrative  ? Not on file  ? ?Social Determinants of Health  ? ?Financial Resource Strain: Not on file  ?Food Insecurity: Not on file  ?Transportation Needs: Not on file  ?Physical Activity: Not on file  ?Stress: Not on file  ?Social Connections: Not on file  ? ?Additional Social History:  ?  ?Sleep: Good ? ?Appetite:  Good ? ?Current Medications: ?Current Facility-Administered Medications  ?Medication Dose Route Frequency Provider  Last Rate Last Admin  ? acetaminophen (TYLENOL) tablet 650 mg  650 mg Oral Q6H PRN Oneta Rack, NP      ? alum & mag hydroxide-simeth (MAALOX/MYLANTA) 200-200-20 MG/5ML suspension 30 mL  30 mL Oral Q4H PRN Oneta Rack, NP      ? [START ON 10/09/2021] FLUoxetine (PROZAC) capsule 40 mg  40 mg Oral Daily Eliseo Gum B, MD      ? hydrOXYzine (ATARAX) tablet 25 mg  25 mg Oral TID PRN Oneta Rack, NP   25 mg at 10/06/21 0032  ? OLANZapine zydis (ZYPREXA) disintegrating tablet 5 mg  5 mg Oral Q8H PRN Massengill, Harrold Donath, MD      ? And  ? LORazepam  (ATIVAN) tablet 1 mg  1 mg Oral PRN Massengill, Harrold Donath, MD      ? And  ? ziprasidone (GEODON) injection 20 mg  20 mg Intramuscular PRN Massengill, Harrold Donath, MD      ? magnesium hydroxide (MILK OF MAGNESIA) suspension 30 mL  30 mL Oral Daily PRN Oneta Rack, NP      ? nicotine polacrilex (NICORETTE) gum 2 mg  2 mg Oral PRN Jaclyn Shaggy, PA-C   2 mg at 10/08/21 1324  ? QUEtiapine (SEROQUEL) tablet 100 mg  100 mg Oral QHS Eliseo Gum B, MD   100 mg at 10/07/21 2118  ? traZODone (DESYREL) tablet 50 mg  50 mg Oral QHS PRN Oneta Rack, NP   50 mg at 10/07/21 2118  ? ? ?Lab Results: No results found for this or any previous visit (from the past 48 hour(s)). ? ?Blood Alcohol level:  ?Lab Results  ?Component Value Date  ? ETH <10 09/18/2021  ? ETH <10 03/24/2021  ? ? ?Metabolic Disorder Labs: ?Lab Results  ?Component Value Date  ? HGBA1C 5.3 10/05/2021  ? MPG 105 10/05/2021  ? MPG 96.8 03/27/2021  ? ?No results found for: PROLACTIN ?Lab Results  ?Component Value Date  ? CHOL 124 10/05/2021  ? TRIG 68 10/05/2021  ? HDL 35 (L) 10/05/2021  ? CHOLHDL 3.5 10/05/2021  ? VLDL 14 10/05/2021  ? LDLCALC 75 10/05/2021  ? LDLCALC 87 03/27/2021  ? ? ?Physical Findings: ?AIMS:  , ,  ,  ,    ?CIWA:    ?COWS:    ? ?Musculoskeletal: ?Strength & Muscle Tone: within normal limits ?Gait & Station: normal ?Patient leans: N/A ? ?Psychiatric Specialty Exam: ? ?Presentation  ?General Appearance: Appropriate for Environment; Casual ? ?Eye Contact:Good ? ?Speech:Clear and Coherent; Normal Rate ? ?Speech Volume:Normal ? ?Handedness:Right ? ? ?Mood and Affect  ?Mood:Anxious; Depressed ? ?Affect:Congruent; Depressed ? ?Thought Process  ?Thought Processes:Coherent; Linear ? ?Descriptions of Associations:Intact ? ?Orientation:Full (Time, Place and Person) ? ?Thought Content:Logical; WDL ? ?History of Schizophrenia/Schizoaffective disorder:No ? ?Duration of Psychotic Symptoms:No data recorded ?Hallucinations:Hallucinations: None ? ?Ideas of  Reference:None ? ?Suicidal Thoughts:Suicidal Thoughts: No ? ?Homicidal Thoughts:Homicidal Thoughts: No ? ?Sensorium  ?Memory:Immediate Fair; Recent Fair; Remote Fair ? ?Judgment:Fair ? ?Insight:Fair ? ?Executive Functions  ?Concentration:Good ? ?Attention Span:Good ? ?Recall:Fair ? ?Fund of Knowledge:Fair ? ?Language:Good ? ?Psychomotor Activity  ?Psychomotor Activity:Psychomotor Activity: Normal ? ?Assets  ?Assets:Communication Skills; Physical Health; Social Support ? ?Sleep  ?Sleep:Sleep: Good ?Number of Hours of Sleep: 6 ? ?Physical Exam: ?Physical Exam ?Vitals and nursing note reviewed.  ?Constitutional:   ?   Appearance: Normal appearance.  ?HENT:  ?   Head: Normocephalic and atraumatic.  ?   Right Ear: External ear normal.  ?  Left Ear: External ear normal.  ?   Nose: Nose normal.  ?   Mouth/Throat:  ?   Mouth: Mucous membranes are moist.  ?   Pharynx: Oropharynx is clear.  ?Eyes:  ?   Extraocular Movements: Extraocular movements intact.  ?   Conjunctiva/sclera: Conjunctivae normal.  ?   Pupils: Pupils are equal, round, and reactive to light.  ?Cardiovascular:  ?   Rate and Rhythm: Tachycardia present.  ?   Comments: BP 146/90 and Pulse 120. Nursing staff to recheck VS ?Pulmonary:  ?   Effort: Pulmonary effort is normal.  ?Abdominal:  ?   Palpations: Abdomen is soft.  ?Genitourinary: ?   Comments: Deferred ?Musculoskeletal:     ?   General: Normal range of motion.  ?   Cervical back: Normal range of motion and neck supple.  ?Neurological:  ?   General: No focal deficit present.  ?   Mental Status: He is alert and oriented to person, place, and time.  ?Psychiatric:     ?   Behavior: Behavior normal.  ? ?Review of Systems  ?Constitutional:  Negative for chills and fever.  ?HENT:  Negative for congestion, hearing loss, sinus pain, sore throat and tinnitus.   ?Eyes: Negative.  Negative for blurred vision and photophobia.  ?Respiratory: Negative.  Negative for cough, sputum production, shortness of breath,  wheezing and stridor.   ?Cardiovascular: Negative.  Negative for chest pain and palpitations.  ?     BP 146/90 and Pulse 120. Nursing staff to recheck VS ?  ?Gastrointestinal: Negative.  Negative for abdominal

## 2021-10-08 NOTE — Group Note (Signed)
Social Work Group Therapy ?10/05/2021 ?3:00pm-4:00pm ? ?  ?Topic:  Mental Health Myths and Facts ?  ?Description of Group:   Due to staff recommendation and patient confinement to the hall and subsequent inability to enter the dayroom, psychotherapy group was not held.  Patient was provided with written information about mental health myths and facts.  Patient did not ask questions about the handoff. ? ?Clent Damore Grossman-Orr, MSW, LCSW ?10/05/2021 4:00pm ?

## 2021-10-08 NOTE — Progress Notes (Signed)
?   10/07/21 2118  ?Psych Admission Type (Psych Patients Only)  ?Admission Status Voluntary  ?Psychosocial Assessment  ?Patient Complaints Anxiety  ?Eye Contact Fair  ?Facial Expression Anxious  ?Affect Appropriate to circumstance  ?Speech Logical/coherent  ?Interaction Assertive  ?Motor Activity Other (Comment) ?(WDL)  ?Appearance/Hygiene Unremarkable  ?Behavior Characteristics Appropriate to situation  ?Mood Anxious;Pleasant  ?Thought Process  ?Coherency WDL  ?Content WDL  ?Delusions None reported or observed  ?Perception WDL  ?Hallucination None reported or observed  ?Judgment WDL  ?Confusion None  ?Danger to Self  ?Current suicidal ideation? Denies  ?Self-Injurious Behavior No self-injurious ideation or behavior indicators observed or expressed   ?Agreement Not to Harm Self Yes  ?Description of Agreement Verbal Contract  ?Danger to Others  ?Danger to Others None reported or observed  ? ? ?

## 2021-10-08 NOTE — [facility] Group Notes (Signed)
.  Psychoeducational Group Note ? ? ? ?Date:  4/8//23 ?Time: 1100-1200 ? ? ? ?Purpose of Group: . The group focus' on teaching patients on how to identify their needs and their Life Skills:  [name] group where two lists are made. What people need and what are things that we do that are unhealthy. The lists are developed by the patients and it is explained that we often do the actions that are not healthy to get our list of needs met. ? ?Goal:: to develop the coping skills needed to get their needs met ? ?Participation Level:  Active ? ?Participation Quality:  Appropriate ? ?Affect:  Appropriate ? ?Cognitive:  Oriented ? ?Insight:  Improving ? ?Engagement in Group:  Engaged ? ?Additional Comments: Pt attended the group and was quite engaged in the discussion. Rates his energy at [name] 5/10. Added greatly to the group. ? ?William [name] ? ?

## 2021-10-08 NOTE — [facility] Group Notes (Signed)
Pt attended and contributed to group 

## 2021-10-09 DIAGNOSIS — F332 Major depressive disorder, recurrent severe without psychotic features: Principal | ICD-10-CM

## 2021-10-09 NOTE — Group Note (Signed)
LCSW Group Therapy Note ? ?Date/Time:    10/09/2021  10am-11am ? ?Type of Therapy and Topic:   ?Group Therapy:  Saying Goodbye to an Unhealthy Coping Skill or Unhealthy Support ? ?Participation Level:  Active  ? ?Description of Group: ? ?A handout was provided to the patients in lieu of group, although they were in a group setting at the time (in the hallway).  The handout listed characteristics of unhealthy coping skills/supports and healthy coping skills/supports.  There were spaces left for the patient to add their own ideas.  The handout then gave them space to make a personal list of their own healthy and unhealthy coping skills, as well as additional space to make a personal list of their own healthy and unhealthy supports.  They were then given the opportunity to choose stationery on which to write a goodbye letter to one of their unhealthy coping skills or supports. ? ?Therapeutic Goals ?Patients learned that the main similarity between healthy and unhealthy coping techniques and supports is that they all work initially, and the main difference is that the unhealthy ones stop working and/or start hurting. ?Patients were given the opportunity to identify some of their own unhealthy habits and/or supports. ?Patients chose stationery to use to write a goodbye letter to an unhealthy habit or support  ?Patients were encouraged to provide support and ideas to each other. ? ?Summary of Patient Progress: Patient responded with enthusiasm to the activity. ? ? ?Therapeutic Modalities ?Activity  ?Processing ? ? ?Selmer Dominion, LCSW  ?

## 2021-10-09 NOTE — Progress Notes (Signed)
[facility] MD Progress Note ? ?10/09/2021 2:43 PM ?William [name]  ?MRN:  [id number] ? ?Subjective:  "I feel better with the treatment therapy received at this hospital, including the medication and group and counseling from the staff. I would also want to be recommended to out patient treatment therapy for total wellness." ? ?Brief History:William [name] is a 21 year old patient with a PPH of MDD, recurrent severe w/o psychosis and no pertinent PMH [facility] endors[facility] SI and worsening mood. ?  ?Daily Notes 10/09/21: On assessment today patient is examined sitting on the chair in the office. Chart is reviewed and findings shared with the treatment team and discussed with Dr. Loleta ChanceHill. He is calm and responsive appropriately to questions. Good eye contact on encounter. Speech is fluent and with normal pattern and volume.  Alert and oriented to person, place, time and situation.  Mood/Affect is slight anxious and depressed. Rated anxiety as"2" and depression as "2" on a scale of 0 to 10. Thought process is coherent and linear and thought content is logical and within normal limit.  Good concentration and attention span.  ?  ?Patient reported happy and good mood. Endorsed great appetite and consuming 100% of meals, and this morning stated, "I ate 2 portions of breakfast." Admitted to sleeping at least 6 hours last night. Denied Suicide ideation, homicidal ideation, auditory or visual hallucination. Denied medication side effects and no craving for cigarettes and substance use at this time. Compliant with all medications at this time. Actively attending group milieu and communicating with peers on the hall way. Reported to this Provider that he masturbated in bed and slight serosanguinous fluid stain was on the bed sheet. Asked the Provider is this would affect his fertility in the future. Informed the patient that I have not seen any research study that allude to masturbation and fertility. Instruct patient to  inform nursing staff if bright red blood is noted. ? ? ?Principal Problem: MDD (major depressive disorder), recurrent episode, severe (HCC) ? ?Diagnosis: Principal Problem: ?  MDD (major depressive disorder), recurrent episode, severe (HCC) ? ?Total Time spent with patient: 20 minutes ? ?[facility] w/o psychosis dc'd on Prozac 40mg  and Seroquel 50mg . Patient has not been compliant since discharge. ?  ? ?Past Medical History:  ?Past Medical History:  ?Diagnosis Date  ? Anxiety   ? Hypertension   ? Migraine   ? History reviewed. No pertinent surgical history. ? ?Family History: History reviewed. No pertinent family history. ? ?Family Psychiatric  History:  Mom: Depression and anxiety ?Dad: Etoh use disorder in past ? ?Social History:  ?Social History  ? ?Substance and Sexual Activity  ?Alcohol Use Yes  ? Comment: "a few days out of the week"  ?   ?Social History  ? ?Substance and Sexual Activity  ?Drug Use Yes  ? Types: Marijuana  ? Comment: daily, also mushrooms  ?  ?Social History  ? ?Socioeconomic History  ? Marital status: Single  ?  Spouse name: Not on file  ? Number of children: Not on file  ? Years of education: Not on file  ? Highest education level: Not on file  ?Occupational History  ? Not on file  ?Tobacco Use  ? Smoking status: Some Days  ?  Types: Cigarettes  ? Smokeless tobacco: Never  ?Vaping Use  ? Vaping Use: Every day  ?Substance and Sexual Activity  ? Alcohol use: Yes  ?  Comment: "a few  days out of the week"  ? Drug use: Yes  ?  Types: Marijuana  ?  Comment: daily, also mushrooms  ? Sexual activity: Not on file  ?Other Topics Concern  ? Not on file  ?Social History Narrative  ? Not on file  ? ?Social Determinants of Health  ? ?Financial Resource Strain: Not on file  ?Food Insecurity: Not on file  ?Transportation Needs: Not on file  ?Physical Activity: Not on file  ?Stress: Not on file  ?Social Connections: Not on file  ? ?Additional Social History:  ?  ?Sleep:  Good ? ?Appetite:  Good ? ?Current Medications: ?Current Facility-Administered Medications  ?Medication Dose Route Frequency Provider Last Rate Last Admin  ? acetaminophen (TYLENOL) tablet 650 mg  650 mg Oral Q6H PRN Oneta Rack, NP      ? alum & mag hydroxide-simeth (MAALOX/MYLANTA) 200-200-20 MG/5ML suspension 30 mL  30 mL Oral Q4H PRN Oneta Rack, NP      ? FLUoxetine (PROZAC) capsule 40 mg  40 mg Oral Daily Eliseo Gum B, MD   40 mg at 10/09/21 0749  ? hydrOXYzine (ATARAX) tablet 25 mg  25 mg Oral TID PRN Oneta Rack, NP   25 mg at 10/08/21 2123  ? OLANZapine zydis (ZYPREXA) disintegrating tablet 5 mg  5 mg Oral Q8H PRN Massengill, Harrold Donath, MD      ? And  ? LORazepam (ATIVAN) tablet 1 mg  1 mg Oral PRN Massengill, Harrold Donath, MD      ? And  ? ziprasidone (GEODON) injection 20 mg  20 mg Intramuscular PRN Massengill, Harrold Donath, MD      ? magnesium hydroxide (MILK OF MAGNESIA) suspension 30 mL  30 mL Oral Daily PRN Oneta Rack, NP      ? nicotine polacrilex (NICORETTE) gum 2 mg  2 mg Oral PRN Jaclyn Shaggy, PA-C   2 mg at 10/09/21 0753  ? QUEtiapine (SEROQUEL) tablet 100 mg  100 mg Oral QHS Eliseo Gum B, MD   100 mg at 10/08/21 2123  ? traZODone (DESYREL) tablet 50 mg  50 mg Oral QHS PRN Oneta Rack, NP   50 mg at 10/08/21 2123  ? ? ?Lab Results: No results found for this or any previous visit (from the past 48 hour(s)). ? ?Blood Alcohol level:  ?Lab Results  ?Component Value Date  ? ETH <10 09/18/2021  ? ETH <10 03/24/2021  ? ? ?Metabolic Disorder Labs: ?Lab Results  ?Component Value Date  ? HGBA1C 5.3 10/05/2021  ? MPG 105 10/05/2021  ? MPG 96.8 03/27/2021  ? ?No results found for: PROLACTIN ?Lab Results  ?Component Value Date  ? CHOL 124 10/05/2021  ? TRIG 68 10/05/2021  ? HDL 35 (L) 10/05/2021  ? CHOLHDL 3.5 10/05/2021  ? VLDL 14 10/05/2021  ? LDLCALC 75 10/05/2021  ? LDLCALC 87 03/27/2021  ? ? ?Physical Findings: ?AIMS:  , ,  ,  ,    ?CIWA:    ?COWS:    ? ?Musculoskeletal: ?Strength & Muscle  Tone: within normal limits ?Gait & Station: normal ?Patient leans: N/A ? ?Psychiatric Specialty Exam: ? ?Presentation  ?General Appearance: Appropriate for Environment; Casual; Fairly Groomed ? ?Eye Contact:Fair; Good ? ?Speech:Clear and Coherent; Normal Rate ? ?Speech Volume:Normal ? ?Handedness:Right ? ? ?Mood and Affect  ?Mood:Anxious; Depressed ? ?Affect:Congruent; Appropriate ? ?Thought Process  ?Thought Processes:Coherent; Linear ? ?Descriptions of Associations:Intact ? ?Orientation:Full (Time, Place and Person) ? ?Thought Content:Logical; WDL ? ?History of Schizophrenia/Schizoaffective disorder:No ? ?  Duration of Psychotic Symptoms:No data recorded ?Hallucinations:Hallucinations: None ? ?Ideas of Reference:None ? ?Suicidal Thoughts:Suicidal Thoughts: No ?SI Passive Intent and/or Plan: -- (Denied) ? ?Homicidal Thoughts:Homicidal Thoughts: No ? ?Sensorium  ?Memory:Immediate Fair; Recent Fair; Remote Fair ? ?Judgment:Fair ? ?Insight:Fair ? ?Executive Functions  ?Concentration:Fair ? ?Attention Span:Fair ? ?Recall:Fair ? ?Fund of Knowledge:Fair ? ?Language:Good ? ?Psychomotor Activity  ?Psychomotor Activity:Psychomotor Activity: Normal ? ?Assets  ?Assets:Communication Skills; Desire for Improvement; Social Support; Physical Health ? ?Sleep  ?Sleep:Sleep: Good ?Number of Hours of Sleep: 6 ? ?Physical Exam: ?Physical Exam ?Vitals and nursing note reviewed.  ?Constitutional:   ?   Appearance: Normal appearance.  ?HENT:  ?   Head: Normocephalic and atraumatic.  ?   Right Ear: External ear normal.  ?   Left Ear: External ear normal.  ?   Nose: Nose normal.  ?   Mouth/Throat:  ?   Mouth: Mucous membranes are moist.  ?   Pharynx: Oropharynx is clear.  ?Eyes:  ?   Extraocular Movements: Extraocular movements intact.  ?   Conjunctiva/sclera: Conjunctivae normal.  ?   Pupils: Pupils are equal, round, and reactive to light.  ?Cardiovascular:  ?   Rate and Rhythm: Normal rate.  ?   Pulses: Normal pulses.  ?   Comments: T  97.5. Nursing staff to recheck VS ?Pulmonary:  ?   Effort: Pulmonary effort is normal.  ?Abdominal:  ?   Palpations: Abdomen is soft.  ?Genitourinary: ?   Comments: Deferred ?Musculoskeletal:     ?   General: N

## 2021-10-09 NOTE — Group Note (Unsigned)
Date:  10/09/2021 ?Time:  12:14 PM ? ?Group Topic/Focus:  ?Orientation:   The focus of this group is to educate the patient on the purpose and policies of crisis stabilization and provide a format to answer questions about their admission.  The group details unit policies and expectations of patients while admitted. ? ? ? ? ?Participation Level:  {[facility] PARTICIPATION LEVEL:22264} ? ?Participation Quality:  {[facility] PARTICIPATION QUALITY:22265} ? ?Affect:  {[facility] AFFECT:22266} ? ?Cognitive:  {[facility] COGNITIVE:22267} ? ?Insight: {[facility] Insight2:20797} ? ?Engagement in Group:  {[facility] ENGAGEMENT IN JY:3131603 ? ?Modes of Intervention:  {[facility] MODES OF INTERVENTION:22269} ? ?Additional Comments:  *** ? ?Jerrye Beavers ?10/09/2021, 12:14 PM ? ?

## 2021-10-09 NOTE — Group Note (Signed)
Date:  10/09/2021 ?Time:  12:10 PM ? ?Group Topic/Focus:  ?Orientation:   The focus of this group is to educate the patient on the purpose and policies of crisis stabilization and provide a format to answer questions about their admission.  The group details unit policies and expectations of patients while admitted. ? ? ? ?Participation Level:  Active ? ?Participation Quality:  Appropriate ? ?Affect:  Appropriate ? ?Cognitive:  Appropriate ? ?Insight: Appropriate ? ?Engagement in Group:  Engaged ? ?Modes of Intervention:  Discussion ? ?Additional Comments:  Patient was active in group as evidenced by asking question about the discharge process. ? ?Reymundo Poll ?10/09/2021, 12:10 PM ? ?

## 2021-10-09 NOTE — Progress Notes (Signed)
D. Pt presented with a bright affect/ anxious mood- calm, cooperative behavior- inquiring about discharge upon initial approach this am. Pt reported that he is planning on moving back in with his mother upon discharge, and is working on developing a plan for when he gets out of the hospital. Pt rated his depression, hopelessness and anxiety a 5/0/1, respectively. Pt currently denies SI/HI and AVH   ?A. Labs and vitals monitored. Pt given and educated on medications. Pt supported emotionally and encouraged to express concerns and ask questions.   ?R. Pt remains safe with 15 minute checks. Will continue POC. ? ?  ?

## 2021-10-09 NOTE — Group Note (Incomplete)
Date:  10/09/2021 ?Time:  10:43 AM ? ?Group Topic/Focus:  ?Orientation:   The focus of this group is to educate the patient on the purpose and policies of crisis stabilization and provide a format to answer questions about their admission.  The group details unit policies and expectations of patients while admitted. ? ? ? ?Participation Level:  Active ? ?Participation Quality:  Appropriate ? ?Affect:  Appropriate ? ?Cognitive:  {[facility] COGNITIVE:22267} ? ?Insight: {[facility] Insight2:20797} ? ?Engagement in Group:  {[facility] ENGAGEMENT IN WNUUV:25366} ? ?Modes of Intervention:  {[facility] MODES OF INTERVENTION:22269} ? ?Additional Comments:  *** ? ?Reymundo Poll ?10/09/2021, 10:43 AM ? ?

## 2021-10-09 NOTE — [facility] Group Notes (Signed)
Adult Psychoeducational Group Not ?Date:  10/09/2021 ?Time:  4709-6283 ?Group Topic/Focus: PROGRESSIVE RELAXATION. A group where deep breathing is taught and tensing and relaxation muscle groups is used. Imagery is used as well.  Pts are asked to imagine 3 pillars that hold them up when they are not able to hold themselves up and to share that with the group. ? ?Participation Level:  Active ? ?Participation Quality:  Appropriate ? ?Affect:  Appropriate ? ?Cognitive:  Oriented ? ?Insight: Improving ? ?Engagement in Group:  Engaged ? ?Modes of Intervention:  Activity, Discussion, Education, and Support ? ?Additional Comments:  Pt rates his energy at a 7/10. States music, support from friends and his mom hold him up. ? ?Vira Blanco A ? ? ?

## 2021-10-09 NOTE — Progress Notes (Signed)
?   10/09/21 2122  ?Psych Admission Type (Psych Patients Only)  ?Admission Status Voluntary  ?Psychosocial Assessment  ?Patient Complaints Depression;Sadness;Anxiety  ?Eye Contact Fair  ?Facial Expression Anxious  ?Affect Appropriate to circumstance  ?Speech Logical/coherent  ?Interaction Assertive  ?Motor Activity Other (Comment)  ?Appearance/Hygiene Other (Comment) ?(WDL)  ?Behavior Characteristics Cooperative;Appropriate to situation  ?Mood Pleasant  ?Thought Process  ?Coherency WDL  ?Content WDL  ?Delusions None reported or observed  ?Perception WDL  ?Hallucination None reported or observed  ?Judgment WDL  ?Confusion WDL  ?Danger to Self  ?Current suicidal ideation? Denies  ?Self-Injurious Behavior No self-injurious ideation or behavior indicators observed or expressed   ?Agreement Not to Harm Self Yes  ?Description of Agreement Verbal Contract  ?Danger to Others  ?Danger to Others None reported or observed  ? ? ?

## 2021-10-09 NOTE — Progress Notes (Signed)
Adult Psychoeducational Group Note ? ?Date:  10/09/2021 ?Time:  7:36 AM ? ?Group Topic/Focus:  ?Wrap-Up Group:   The focus of this group is to help patients review their daily goal of treatment and discuss progress on daily workbooks. ? ?Participation Level:  Active ? ?Participation Quality:  Appropriate and Attentive ? ?Affect:  Appropriate ? ?Cognitive:  Appropriate ? ?Insight: Appropriate ? ?Engagement in Group:  Engaged ? ?Modes of Intervention:  Discussion ? ?Additional Comments:   ? ?Charna Busman Long ?10/09/2021, 7:36 AM ?

## 2021-10-09 NOTE — Progress Notes (Signed)
?   10/08/21 2200  ?Psych Admission Type (Psych Patients Only)  ?Admission Status Voluntary  ?Psychosocial Assessment  ?Patient Complaints None  ?Eye Contact Fair  ?Facial Expression Anxious  ?Affect Appropriate to circumstance  ?Speech Logical/coherent  ?Interaction Assertive  ?Motor Activity Slow  ?Appearance/Hygiene Improved  ?Behavior Characteristics Appropriate to situation;Cooperative  ?Mood Pleasant  ?Thought Process  ?Coherency WDL  ?Content WDL  ?Delusions None reported or observed  ?Perception WDL  ?Hallucination None reported or observed  ?Judgment WDL  ?Confusion None  ?Danger to Self  ?Current suicidal ideation? Denies  ?Self-Injurious Behavior No self-injurious ideation or behavior indicators observed or expressed   ?Agreement Not to Harm Self Yes  ?Description of Agreement verbal  ?Danger to Others  ?Danger to Others None reported or observed  ? ? ?

## 2021-10-10 ENCOUNTER — Encounter (HOSPITAL_COMMUNITY): Payer: Self-pay

## 2021-10-10 MED ORDER — PROPRANOLOL HCL 10 MG PO TABS
10.0000 mg | ORAL_TABLET | Freq: Two times a day (BID) | ORAL | Status: DC
  Administered 2021-10-10 – 2021-10-11 (×3): 10 mg via ORAL
  Filled 2021-10-10 (×6): qty 1

## 2021-10-10 NOTE — Progress Notes (Signed)
RN reviewed William [name]', ([employer] Nursing Student) flowsheet documenation from today's pt assessment.  RN in agreement with William's documentation and pt's plan of care. ?

## 2021-10-10 NOTE — Progress Notes (Signed)
Pt reported that "I am too young and have too much life to live to ever be thinking about ending my life.  I have too much to live for."  Pt endorses  having anxiety.  Pt declined Atarax.  Pt is actively engaging with other pt's and appears to be in no distress. NO AVH noted.  NO HI.   RN will continue to monitor and provide support as needed. ?

## 2021-10-10 NOTE — BH IP Treatment Plan (Signed)
Interdisciplinary Treatment and Diagnostic Plan Update ? ?10/10/2021 ?Time of Session: 9:05am ?William [name] ?MRN: [id number] ? ?Principal Diagnosis: MDD (major depressive disorder), recurrent episode, severe (HCC) ? ?Secondary Diagnoses: Principal Problem: ?  MDD (major depressive disorder), recurrent episode, severe (HCC) ? ? ?Current Medications:  ?Current Facility-Administered Medications  ?Medication Dose Route Frequency Provider Last Rate Last Admin  ? acetaminophen (TYLENOL) tablet 650 mg  650 mg Oral Q6H PRN Oneta Rack, NP      ? alum & mag hydroxide-simeth (MAALOX/MYLANTA) 200-200-20 MG/5ML suspension 30 mL  30 mL Oral Q4H PRN Oneta Rack, NP      ? FLUoxetine (PROZAC) capsule 40 mg  40 mg Oral Daily Eliseo Gum B, MD   40 mg at 10/10/21 3220  ? hydrOXYzine (ATARAX) tablet 25 mg  25 mg Oral TID PRN Oneta Rack, NP   25 mg at 10/08/21 2123  ? OLANZapine zydis (ZYPREXA) disintegrating tablet 5 mg  5 mg Oral Q8H PRN Massengill, Harrold Donath, MD      ? And  ? LORazepam (ATIVAN) tablet 1 mg  1 mg Oral PRN Massengill, Harrold Donath, MD      ? And  ? ziprasidone (GEODON) injection 20 mg  20 mg Intramuscular PRN Massengill, Harrold Donath, MD      ? magnesium hydroxide (MILK OF MAGNESIA) suspension 30 mL  30 mL Oral Daily PRN Oneta Rack, NP      ? nicotine polacrilex (NICORETTE) gum 2 mg  2 mg Oral PRN Jaclyn Shaggy, PA-C   2 mg at 10/10/21 0800  ? propranolol (INDERAL) tablet 10 mg  10 mg Oral BID Eliseo Gum B, MD      ? QUEtiapine (SEROQUEL) tablet 100 mg  100 mg Oral QHS Eliseo Gum B, MD   100 mg at 10/09/21 2122  ? traZODone (DESYREL) tablet 50 mg  50 mg Oral QHS PRN Oneta Rack, NP   50 mg at 10/09/21 2122  ? ?PTA Medications: ?Medications Prior to Admission  ?Medication Sig Dispense Refill Last Dose  ? FLUoxetine (PROZAC) 40 MG capsule Take 1 capsule (40 mg total) by mouth daily. (Patient not taking: Reported on 10/04/2021) 30 capsule 0 Not Taking  ? nicotine polacrilex (NICORETTE) 2 MG gum Take 1 each  (2 mg total) by mouth as needed for up to 50 doses for smoking cessation. (Patient taking differently: Take 2 mg by mouth as needed for smoking cessation.) 100 tablet 0   ? QUEtiapine (SEROQUEL XR) 50 MG TB24 24 hr tablet Take 1 tablet (50 mg total) by mouth at bedtime. (Patient not taking: Reported on 10/04/2021) 30 tablet 0 Not Taking  ? ? ?Patient Stressors: Financial difficulties   ?Occupational concerns   ? ?Patient Strengths: Capable of independent living  ?Motivation for treatment/growth  ?Supportive family/friends  ? ?Treatment Modalities: Medication Management, Group therapy, Case management,  ?1 to 1 session with clinician, Psychoeducation, Recreational therapy. ? ? ?Physician Treatment Plan for Primary Diagnosis: MDD (major depressive disorder), recurrent episode, severe (HCC) ?Long Term Goal(s): Improvement in symptoms so as ready for discharge  ? ?Short Term Goals: Ability to identify changes in lifestyle to reduce recurrence of condition will improve ?Ability to verbalize feelings will improve ?Ability to disclose and discuss suicidal ideas ?Ability to demonstrate self-control will improve ?Ability to identify and develop effective coping behaviors will improve ?Compliance with prescribed medications will improve ?Ability to identify triggers associated with substance abuse/mental health issues will improve ? ?Medication Management: Evaluate patient's response, side  effects, and tolerance of medication regimen. ? ?Therapeutic Interventions: 1 to 1 sessions, Unit Group sessions and Medication administration. ? ?Evaluation of Outcomes: Progressing ? ?Physician Treatment Plan for Secondary Diagnosis: Principal Problem: ?  MDD (major depressive disorder), recurrent episode, severe (HCC) ? ?Long Term Goal(s): Improvement in symptoms so as ready for discharge  ? ?Short Term Goals: Ability to identify changes in lifestyle to reduce recurrence of condition will improve ?Ability to verbalize feelings will  improve ?Ability to disclose and discuss suicidal ideas ?Ability to demonstrate self-control will improve ?Ability to identify and develop effective coping behaviors will improve ?Compliance with prescribed medications will improve ?Ability to identify triggers associated with substance abuse/mental health issues will improve    ? ?Medication Management: Evaluate patient's response, side effects, and tolerance of medication regimen. ? ?Therapeutic Interventions: 1 to 1 sessions, Unit Group sessions and Medication administration. ? ?Evaluation of Outcomes: Progressing ? ? ?RN Treatment Plan for Primary Diagnosis: MDD (major depressive disorder), recurrent episode, severe (HCC) ?Long Term Goal(s): Knowledge of disease and therapeutic regimen to maintain health will improve ? ?Short Term Goals: Ability to remain free from injury will improve, Ability to verbalize frustration and anger appropriately will improve, Ability to demonstrate self-control, Ability to identify and develop effective coping behaviors will improve, and Compliance with prescribed medications will improve ? ?Medication Management: RN will administer medications as ordered by provider, will assess and evaluate patient's response and provide education to patient for prescribed medication. RN will report any adverse and/or side effects to prescribing provider. ? ?Therapeutic Interventions: 1 on 1 counseling sessions, Psychoeducation, Medication administration, Evaluate responses to treatment, Monitor vital signs and CBGs as ordered, Perform/monitor CIWA, COWS, AIMS and Fall Risk screenings as ordered, Perform wound care treatments as ordered. ? ?Evaluation of Outcomes: Progressing ? ? ?LCSW Treatment Plan for Primary Diagnosis: MDD (major depressive disorder), recurrent episode, severe (HCC) ?Long Term Goal(s): Safe transition to appropriate next level of care at discharge, Engage patient in therapeutic group addressing interpersonal concerns. ? ?Short  Term Goals: Engage patient in aftercare planning with referrals and resources, Increase social support, Increase ability to appropriately verbalize feelings, Identify triggers associated with mental health/substance abuse issues, and Increase skills for wellness and recovery ? ?Therapeutic Interventions: Assess for all discharge needs, 1 to 1 time with Child psychotherapistocial worker, Explore available resources and support systems, Assess for adequacy in community support network, Educate family and significant other(s) on suicide prevention, Complete Psychosocial Assessment, Interpersonal group therapy. ? ?Evaluation of Outcomes: Progressing ? ? ?Progress in Treatment: ?Attending groups: Yes. ?Participating in groups: Yes. ?Taking medication as prescribed: Yes. ?Toleration medication: Yes. ?Family/Significant other contact made: Yes, individual(s) contacted:  Mother  ?Patient understands diagnosis: Yes. ?Discussing patient identified problems/goals with staff: Yes. ?Medical problems stabilized or resolved: Yes. ?Denies suicidal/homicidal ideation: Yes. ?Issues/concerns per patient self-inventory: No. ?  ?  ?New problem(s) identified: No, Describe:  None  ?  ?New Short Term/Long Term Goal(s): medication stabilization, elimination of SI thoughts, development of comprehensive mental wellness plan. ?  ?Patient Goals: "To change my thoughts from negative to positive"  ?  ?Discharge Plan or Barriers: Patient is to return home at discharge and is to follow up with his established provider for medication management and is to begin therapy with [facility]. ?  ?Reason for Continuation of Hospitalization:  ?Depression ?Medication stabilization ? ?  ?Estimated Length of Stay: 1-3 days ? ?Last 3 [place] Suicide Severity Risk Score: ?Flowsheet Row [facility] 09/18/2021 [facility] (Dis[facility]) [facility]  ?C-SSRS RISK CATEGORY High Risk High Risk High Risk  ? ?  ? ? ?Last PHQ 2/9 Scores: ?   ? View : No data to display.  ?  ?  ?  ? ? ?Scribe for Treatment Team: ?Kehlani Vancamp A Ru

## 2021-10-10 NOTE — Progress Notes (Addendum)
[facility] MD Progress Note ? ?10/10/2021 8:32 AM ?William [name]  ?MRN:  [id number] ?Subjective:   ?Patient is seen with medical student: Quitman Livings. ? ?Matrix [name] is a 21 year old patient with a PPH of MDD, recurrent severe w/o psychosis and no pertinent PMH who presented to be [facility] as a walk-in endorsing SI and worsening mood. ? ?On assessment today patient reports that he really felt the weekend groups were helpful. Patient reports that he wrote more than the required number of positive things about himself because he feels it was helpful. Patient reports that he did spend time this weekend thinking about boundaries and values. Patient endorsed that he values his optimism, genuiness, being supportive of others, and empathy in himself. Patient reports that he values positive talk, no manipulation tendencies, and consent in others. Patient reports that he would make clear his boundaries to people but if he feels that they continue to cross his boundaries that he would no longer associate with the person. Patient reports that he feels that his medication is very helpful. Patient reports he is sleeping through the night and his thoughts feel more "calm." Patient reports that he has been interacting well with others on the unit and has been feeling supported by his family. Patient reports that he is not having intrusive thoughts and is able to disrupt the previous ruminative thoughts. Patient denies SI, HI, and AVH. Patient reports he plans to get a job when he returns home.  ? ?Principal Problem: MDD (major depressive disorder), recurrent episode, severe ([place]) ?Diagnosis: Principal Problem: ?  MDD (major depressive disorder), recurrent episode, severe ([place]) ? ?Total Time spent with patient: 20 minutes ? ?Past Psychiatric History: See H&P ? ?Past Medical History:  ?Past Medical History:  ?Diagnosis Date  ? Anxiety   ? Hypertension   ? Migraine   ? History reviewed. No pertinent surgical history. ?Family History:  History reviewed. No pertinent family history. ?Family Psychiatric  History:  See H&P ?Social History:  ?Social History  ? ?Substance and Sexual Activity  ?Alcohol Use Yes  ? Comment: "a few days out of the week"  ?   ?Social History  ? ?Substance and Sexual Activity  ?Drug Use Yes  ? Types: Marijuana  ? Comment: daily, also mushrooms  ?  ?Social History  ? ?Socioeconomic History  ? Marital status: Single  ?  Spouse name: Not on file  ? Number of children: Not on file  ? Years of education: Not on file  ? Highest education level: Not on file  ?Occupational History  ? Not on file  ?Tobacco Use  ? Smoking status: Some Days  ?  Types: Cigarettes  ? Smokeless tobacco: Never  ?Vaping Use  ? Vaping Use: Every day  ?Substance and Sexual Activity  ? Alcohol use: Yes  ?  Comment: "a few days out of the week"  ? Drug use: Yes  ?  Types: Marijuana  ?  Comment: daily, also mushrooms  ? Sexual activity: Not on file  ?Other Topics Concern  ? Not on file  ?Social History Narrative  ? Not on file  ? ?Social Determinants of Health  ? ?Financial Resource Strain: Not on file  ?Food Insecurity: Not on file  ?Transportation Needs: Not on file  ?Physical Activity: Not on file  ?Stress: Not on file  ?Social Connections: Not on file  ? ?Additional Social History:  ?  ?  ?  ?  ?  ?  ?  ?  ?  ?  ?  ? ?  Sleep: Good ? ?Appetite:  Good ? ?Current Medications: ?Current Facility-Administered Medications  ?Medication Dose Route Frequency Provider Last Rate Last Admin  ? acetaminophen (TYLENOL) tablet 650 mg  650 mg Oral Q6H PRN Derrill Center, NP      ? alum & mag hydroxide-simeth (MAALOX/MYLANTA) 200-200-20 MG/5ML suspension 30 mL  30 mL Oral Q4H PRN Derrill Center, NP      ? FLUoxetine (PROZAC) capsule 40 mg  40 mg Oral Daily Damita Dunnings B, MD   40 mg at 10/10/21 [id number]  ? hydrOXYzine (ATARAX) tablet 25 mg  25 mg Oral TID PRN Derrill Center, NP   25 mg at 10/08/21 2123  ? OLANZapine zydis (ZYPREXA) disintegrating tablet 5 mg  5 mg Oral Q8H PRN  Tagen Brethauer, Ovid Curd, MD      ? And  ? LORazepam (ATIVAN) tablet 1 mg  1 mg Oral PRN Arminda Foglio, Ovid Curd, MD      ? And  ? ziprasidone (GEODON) injection 20 mg  20 mg Intramuscular PRN Daiana Vitiello, Ovid Curd, MD      ? magnesium hydroxide (MILK OF MAGNESIA) suspension 30 mL  30 mL Oral Daily PRN Derrill Center, NP      ? nicotine polacrilex (NICORETTE) gum 2 mg  2 mg Oral PRN Prescilla Sours, PA-C   2 mg at 10/10/21 0800  ? QUEtiapine (SEROQUEL) tablet 100 mg  100 mg Oral QHS Damita Dunnings B, MD   100 mg at 10/09/21 2122  ? traZODone (DESYREL) tablet 50 mg  50 mg Oral QHS PRN Derrill Center, NP   50 mg at 10/09/21 2122  ? ? ?Lab Results: No results found for this or any previous visit (from the past 48 hour(s)). ? ?Blood Alcohol level:  ?Lab Results  ?Component Value Date  ? ETH <10 09/18/2021  ? ETH <10 03/24/2021  ? ? ?Metabolic Disorder Labs: ?Lab Results  ?Component Value Date  ? HGBA1C 5.3 10/05/2021  ? MPG 105 10/05/2021  ? MPG 96.8 03/27/2021  ? ?No results found for: PROLACTIN ?Lab Results  ?Component Value Date  ? CHOL 124 10/05/2021  ? TRIG 68 10/05/2021  ? HDL 35 (L) 10/05/2021  ? CHOLHDL 3.5 10/05/2021  ? VLDL 14 10/05/2021  ? [place] 75 10/05/2021  ? [place] 87 03/27/2021  ? ? ?Physical Findings: ?AIMS:  , ,  ,  ,    ?CIWA:    ?COWS:    ? ?Musculoskeletal: ?Strength & Muscle Tone: within normal limits ?Gait & Station: normal ?Patient leans: N/A ? ?Psychiatric Specialty Exam: ? ?Presentation  ?General Appearance: Appropriate for Environment; Casual ? ?Eye Contact:Good ? ?Speech:Clear and Coherent ? ?Speech Volume:Normal ? ?Handedness:Right ? ? ?Mood and Affect  ?Mood:Euthymic ? ?Affect:Appropriate; Congruent ? ? ?Thought Process  ?Thought Processes:Goal Directed ? ?Descriptions of Associations:Circumstantial ? ?Orientation:Full (Time, Place and Person) ? ?Thought Content:Logical ? ?History of Schizophrenia/Schizoaffective disorder:No ? ?Duration of Psychotic Symptoms:No data  recorded ?Hallucinations:Hallucinations: None ? ?Ideas of Reference:None ? ?Suicidal Thoughts:Suicidal Thoughts: No ?SI Passive Intent and/or Plan: -- (Denied) ? ?Homicidal Thoughts:Homicidal Thoughts: No ? ? ?Sensorium  ?Memory:Immediate Fair; Recent Fair ? ?Judgment:-- (Improved) ? ?Insight:Fair ? ? ?Executive Functions  ?Concentration:Fair ? ?Attention Span:Fair ? ?Recall:Fair ? ?[place] ? ?Language:Fair ? ? ?Psychomotor Activity  ?Psychomotor Activity:Psychomotor Activity: Normal ? ? ?Assets  ?Assets:Communication Skills; Resilience; Desire for Improvement; Social Support; Housing ? ? ?Sleep  ?Sleep:Sleep: Good ?Number of Hours of Sleep: 6 ? ? ? ?Physical Exam: ?Physical Exam ?Constitutional:   ?  Appearance: Normal appearance.  ?HENT:  ?   Head: Normocephalic and atraumatic.  ?Pulmonary:  ?   Effort: Pulmonary effort is normal.  ?Neurological:  ?   Mental Status: He is alert and oriented to person, place, and time.  ? ?Review of Systems  ?Psychiatric/Behavioral:  Negative for hallucinations and suicidal ideas. The patient does not have insomnia.   ?Blood pressure (!) 140/95, pulse (!) 122, temperature (!) 97.5 ?F (36.4 ?C), temperature source Oral, resp. rate 20, height 6' (1.829 m), weight 95.3 kg, SpO2 97 %. Body mass index is 28.48 kg/m?. ? ? ?Treatment Plan Summary: ?Daily contact with patient to assess and evaluate symptoms and progress in treatment and Medication management ? ?Patient ruminative thoughts appeared to have significantly decreased and patient is able to interact meaningfully with others. Patient is also working on CBT style practice where he is actively trying to change his negative mindset to process, and appears to be doing fairly well.  ? ?MDD, recurrent, severe w/o psychosis ?PTSD ?OCD, mild ?- Continue Prozac 40 mg daily ?- Continue Seroquel 100 mg po daily at bed time ?   ?Continue on agitation protocol: ?  ?Anxiety / Sleep: ?-Continue Atarax 25mg  TID,  anxiety ?-Continue Trazodone 50mg  QHS, insomnia  ? ?Tachycardia with elevated blood pressure ?- Start Propanolol 10mg  BID ?  ?Tobacco Cessation:   ?Nicotine Replacement protocol ?  ?PRN ?-Tylenol 650 mg q6h, pain ?-Maalox 30 ml q4h, indigestion ?-Atarax 25

## 2021-10-10 NOTE — Group Note (Signed)
Recreation Therapy Group Note ? ? ?Group Topic:Team Building  ?Group Date: 10/10/2021 ?Start Time: 0915 ?End Time: 1000 ?Facilitators: Caroll Rancher, LRT,CTRS ?Location: [address] Dayroom ? ? ?Goal Area(s) Addresses:  ?Patient will effectively work with peer towards shared goal.  ?Patient will identify skills used to make activity successful.  ?Patient will identify how skills used during activity can be used to reach post d/c goals.  ? ?Group Description: Straw Bridge. In teams of 3-5, patients were given 15 plastic drinking straws and an equal length of masking tape. Using the materials provided, patients were instructed to build a free standing bridge-like structure to suspend an everyday item (ex: puzzle box) off of the floor or table surface. All materials were required to be used by the team in their design. LRT facilitated post-activity discussion reviewing team process. Patients were encouraged to reflect how the skills used in this activity can be generalized to daily life post discharge.  ? ? ?Affect/Mood: Appropriate ?  ?Participation Level: Engaged ?  ?Participation Quality: Independent ?  ?Behavior: Appropriate ?  ?Speech/Thought Process: Focused ?  ?Insight: Good ?  ?Judgement: Good ?  ?Modes of Intervention: Music and Team-building ?  ?Patient Response to Interventions:  Engaged ?  ?Education Outcome: ? Acknowledges education and In group clarification offered   ? ?Clinical Observations/Individualized Feedback: Pt was bright and seemed to be in a good space. Pt was engaged throughout.  Pt asked questions for clarification when needed.  Pt worked well with peers.  Pt was appropriate during group session.  ? ? ?Plan: Continue to engage patient in RT group sessions 2-3x/week. ? ? ?Caroll Rancher, LRT,CTRS  ?10/10/2021 12:49 PM ?

## 2021-10-10 NOTE — Group Note (Signed)
[facility] LCSW Group Therapy Note ? ?Date/Time: 12/03/14 at 3:00pm ? ?Type of Therapy and Topic:  Group Therapy:  Strengths and Qualities ? ?Participation Level:  Active ? ?Description of Group:   ? In this group patients will be asked to explore and define the terms strength ans qualities.  Patients will be guided to discuss their thoughts, feelings, and behaviors as to where strengths and qualities originate. Participants will then list some of their strengths and qualities related to each subject topic. This group will be process-oriented, with patients participating in exploration of their own experiences as well as giving and receiving support and challenge from other group members. ? ?Therapeutic Goals: ?Patient will identify specific strengths related to their personal life. ?Patient will identify feelings, thoughts, and beliefs about strengths and qualities. ?Patient will identify ways their strengths have been used. Marland Kitchen ?Patient will identify situations where they have helped others or made someone else happy. . ? ?Summary of Patient Progress ?Patient participated in group today. Patient was appropriate, attentive, and sharing in his experiences. Patient was able to discuss some of his feelings and behaviors related to the obstacles he has faced. Patient also shared things he feels he is good at and what he loves about his appearance. Patient interacted positively with staff and peers, and was receptive to feedback provided by staff. Patient identified that he is a good listener.  ? ? ? ? ?Therapeutic Modalities:   ?Cognitive Behavioral Therapy ?Solution Focused Therapy ?Motivational Interviewing ?Brief Therapy ? ? ?Jamaiyah Pyle, LCSW, LCAS ?Clincal Social Worker  ?[facility] ? ? ?

## 2021-10-10 NOTE — [facility] Group Notes (Signed)
Spiritual care group on grief and loss facilitated by chaplain Claudell Kyle, MDiv ? ?Group Goal:  ?Support / Education around grief and loss  ? ?Members engage in facilitated group support and psycho-social education.  ? ?Group Description:  ?Following introductions and group rules, group members engaged in facilitated group dialog and support around topic of loss, with particular support around experiences of loss in their lives. Group Identified types of loss (relationships / self / things) and identified patterns, circumstances, and changes that precipitate losses. Reflected on thoughts / feelings around loss, normalized grief responses, and recognized variety in grief experience. Group noted Worden's four tasks of grief in discussion.  ? ?Group drew on Adlerian / Rogerian, narrative, MI, Worden's Tasks of Grief ? ?Patient Progress: ?William [name] was an active participant in this group. He shared openly about his own grief and listened attentively as others shared. ?

## 2021-10-10 NOTE — [facility] Suicide Risk Assessment (Signed)
[facility] INPATIENT:  Family/Significant Other Suicide Prevention Education ? ?Suicide Prevention Education:  ?Education Completed; mother Lavonia Dana [phone]), has been identified by the patient as the family member/significant other with whom the patient will be residing, and identified as the person(s) who will aid the patient in the event of a mental health crisis (suicidal ideations/suicide attempt).  With written consent from the patient, the family member/significant other has been provided the following suicide prevention education, prior to the and/or following the discharge of the patient. ? ? ?Mother states she will lock up all knives in the house. She has no safety concerns with him discharging.  ? ?The suicide prevention education provided includes the following: ?Suicide risk factors ?Suicide prevention and interventions ?National Suicide Hotline telephone number ?[facility] assessment telephone number ?[facility] Emergency Assistance 911 ?[place] and/or Residential Mobile Crisis Unit telephone number ? ?Request made of family/significant other to: ?Remove weapons (e.g., guns, rifles, knives), all items previously/currently identified as safety concern.   ?Remove drugs/medications (over-the-counter, prescriptions, illicit drugs), all items previously/currently identified as a safety concern. ? ?The family member/significant other verbalizes understanding of the suicide prevention education information provided.  The family member/significant other agrees to remove the items of safety concern listed above. ? ?[place] ?10/10/2021, 11:34 AM ?

## 2021-10-10 NOTE — Progress Notes (Signed)
Adult Psychoeducational Group Note ? ?Date:  10/10/2021 ?Time:  12:03 AM ? ?Group Topic/Focus:  ?Wrap-Up Group:   The focus of this group is to help patients review their daily goal of treatment and discuss progress on daily workbooks. ? ?Participation Level:  Active ? ?Participation Quality:  Appropriate and Attentive ? ?Affect:  Appropriate ? ?Cognitive:  Appropriate ? ?Insight: Appropriate ? ?Engagement in Group:  Engaged ? ?Modes of Intervention:  Discussion ? ?Additional Comments:   ?Pt was engaged during group discussion. Pt rated his day at a 10/10 and stated one of his goals was to better manage his low mood and to gain better coping skills for when he felt negative. Pt stated that he has been able to come up with a list of coping skills and things that he likes about himself and states that he feels like for the first time in a while, he's been able to think positively.  ? ?William [name] ?10/10/2021, 12:03 AM ?

## 2021-10-11 MED ORDER — NICOTINE POLACRILEX 2 MG MT GUM: 2.0000 mg | CHEWING_GUM | OROMUCOSAL | 0 refills | Status: DC | PRN

## 2021-10-11 MED ORDER — FLUOXETINE HCL 40 MG PO CAPS: 40.0000 mg | ORAL_CAPSULE | Freq: Every day | ORAL | 0 refills | Status: DC

## 2021-10-11 MED ORDER — PROPRANOLOL HCL 10 MG PO TABS: 10.0000 mg | ORAL_TABLET | Freq: Two times a day (BID) | ORAL | 0 refills | Status: DC

## 2021-10-11 MED ORDER — HYDROXYZINE HCL 25 MG PO TABS: 25.0000 mg | ORAL_TABLET | Freq: Three times a day (TID) | ORAL | 0 refills | Status: AC | PRN

## 2021-10-11 MED ORDER — TRAZODONE HCL 50 MG PO TABS
50.0000 mg | ORAL_TABLET | Freq: Every evening | ORAL | 0 refills | Status: DC | PRN
Start: 2021-10-11 — End: 2021-12-19

## 2021-10-11 MED ORDER — QUETIAPINE FUMARATE 100 MG PO TABS: 100.0000 mg | ORAL_TABLET | Freq: Every day | ORAL | 0 refills | Status: DC
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
## 2021-10-11 NOTE — Progress Notes (Signed)
Pt complained of blister on his right foot, the assessed it offered ice pack and tylenol for pain, but instead pt asked for something stronger than tylenol. When asked what that, pt replied he did not know. Will continue to monitor. ?

## 2021-10-11 NOTE — Progress Notes (Signed)
?  [facility] Adult Case Management Discharge Plan : ? ?Will you be returning to the same living situation after discharge:  Yes,  home with mother ?At discharge, do you have transportation home?: No. Taxi to be arranged ?Do you have the ability to pay for your medications: Yes,  has insurance ? ?Release of information consent forms completed and in the chart;  Patient's signature needed at discharge. ? ?Patient to Follow up at: ? Follow-up Information   ? ? Leone Payor, FNP Follow up on 10/19/2021.   ?Specialty: Psychiatry ?Why: You have an appointment for medication management services on 10/19/21 at 6:00 pm.  This will be a Virtual appointment.   They will refer you for therapy services as well.  * Please have your discharge summary from this hospitalization available, for your appointment. ?Contact information: ?[address] ?[address] ?[place] [address] ?[phone] ? ? ?  ?  ? ? [facility]. Go on 10/18/2021.   ?Why: You have an appointment for therapy services on 10/18/21 at 1:00 pm. This appointment will be held  in person. ?Contact information: ?[address], [place], [place] ?[place] [address] ?[phone] ? ? ?  ?  ? ?  ?  ? ?  ? ? ?Next level of care provider has access to [facility] Link:no ? ?Safety Planning and Suicide Prevention discussed: Yes,  with mother ? ?  ? ?Has patient been referred to the Quitline?: Patient refused referral ? ?Patient has been referred for addiction treatment: Pt. refused referral ? ?Otelia Santee, LCSW ?10/11/2021, 9:36 AM ?

## 2021-10-11 NOTE — Progress Notes (Signed)
Pt discharged to lobby. Pt was stable and appreciative at that time. All papers and prescriptions were given and valuables returned. Verbal understanding expressed. Denies SI/HI and A/VH. Pt given opportunity to express concerns and ask questions.  

## 2021-10-11 NOTE — Discharge Summary (Signed)
Physician Discharge Summary Note ? ?Patient:  William [name] is an 21 y.o., male ?MRN:  [id number] ?DOB:  [birth year] ?Patient phone:  [phone] (home)  ?Patient address:   ?[address] ?[place] [address],  ?Total Time spent with patient: 20 minutes ? ?Date of Admission:  10/04/2021 ?Date of Discharge: 10/11/2021 ? ?Reason for Admission:  Draydon Clairmont is a 21 year old patient with a PPH of MDD, recurrent severe w/o psychosis and no pertinent PMH who presented to be [facility] as a walk-in endorsing SI and worsening mood. ?  ? ?Principal Problem: MDD (major depressive disorder), recurrent episode, severe (HCC) ?Discharge Diagnoses: Principal Problem: ?  MDD (major depressive disorder), recurrent episode, severe (HCC) ? ? ?Past Psychiatric History: [facility] 03/2021- MDD w/o psychosis dc'd on Prozac 40mg  and Seroquel 50mg . Patient has not been compliant since discharge. ?  ?See's Crystal OP and currently looking for another therapist. ? ?Past Medical History:  ?Past Medical History:  ?Diagnosis Date  ? Anxiety   ? Hypertension   ? Migraine   ? History reviewed. No pertinent surgical history. ?Family History: History reviewed. No pertinent family history. ?Family Psychiatric  History: Mom: Depression and anxiety ?Dad: Etoh use disorder in past ?Social History:  ?Social History  ? ?Substance and Sexual Activity  ?Alcohol Use Yes  ? Comment: "a few days out of the week"  ?   ?Social History  ? ?Substance and Sexual Activity  ?Drug Use Yes  ? Types: Marijuana  ? Comment: daily, also mushrooms  ?  ?Social History  ? ?Socioeconomic History  ? Marital status: Single  ?  Spouse name: Not on file  ? Number of children: Not on file  ? Years of education: Not on file  ? Highest education level: Not on file  ?Occupational History  ? Not on file  ?Tobacco Use  ? Smoking status: Some Days  ?  Types: Cigarettes  ? Smokeless tobacco: Never  ?Vaping Use  ? Vaping Use: Every day  ?Substance and Sexual Activity  ? Alcohol use:  Yes  ?  Comment: "a few days out of the week"  ? Drug use: Yes  ?  Types: Marijuana  ?  Comment: daily, also mushrooms  ? Sexual activity: Not on file  ?Other Topics Concern  ? Not on file  ?Social History Narrative  ? Not on file  ? ?Social Determinants of Health  ? ?Financial Resource Strain: Not on file  ?Food Insecurity: Not on file  ?Transportation Needs: Not on file  ?Physical Activity: Not on file  ?Stress: Not on file  ?Social Connections: Not on file  ? ? ?Hospital Course:  Deigo Alonso is a 21 year old patient with a PPH of MDD, recurrent severe w/o psychosis and no pertinent PMH who presented to be [facility] as a walk-in endorsing SI and worsening mood. ?  ?During the patient's hospitalization, patient had extensive initial psychiatric evaluation, and follow-up psychiatric evaluations every day. ?  ?Psychiatric diagnoses provided upon initial assessment:  ? 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  ?Patient's psychiatric medications were adjusted on admission: - - Start Prozac 10mg , depressed mood, PTSD, and mild OCD ?- Start Trazodone 50mg  QHS PRN ?- Start Hydroxyzine 25mg  TID PRN ?  ?During the hospitalization, other adjustments were made to the patient's psychiatric medication regimen:  ?- Prozac slowly increased to 40mg  daily ?- Patient started on Seroquel 100mg  QHS for  OCD and insomnia ?- patient started on Propanolol 10mg  BID for tachycardia and anxiety ?  ?Gradually, patient started adjusting to milieu.   ?Patient's care was discussed during the interdisciplinary team meeting every day during the hospitalization. ?  ?The patient denied having side effects to prescribed psychiatric medication. ?  ?The patient reports their target psychiatric symptoms of depressed mood, ruminative thoughts, and intrusive thoughts responded well to the psychiatric medications, and the patient  reports overall benefit other psychiatric hospitalization. Supportive psychotherapy was provided to the patient. The patient also participated in regular group therapy while admitted.  ?  ?Labs were reviewed with the patient, and abnormal results were discussed with the patient. ?  ?The patient denied having suicidal thoughts more than 48 hours prior to discharge.  Patient denies having homicidal thoughts.  Patient denies having auditory hallucinations.  Patient denies any visual hallucinations.  Patient denies having paranoid thoughts. ?  ?The patient is able to verbalize their individual safety plan to this provider. ?  ?It is recommended to the patient to continue psychiatric medications as prescribed, after discharge from the hospital.   ?  ?It is recommended to the patient to follow up with your outpatient psychiatric provider and PCP. ?  ?Discussed with the patient, the impact of alcohol, drugs, tobacco have been there overall psychiatric and medical wellbeing, and total abstinence from substance use was recommended the patient.  ? ?Physical Findings: ?AIMS:  , ,  ,  ,    ?CIWA:    ?COWS:    ? ?Musculoskeletal: ?Strength & Muscle Tone: within normal limits ?Gait & Station: normal ?Patient leans: N/A ? ? ?Psychiatric Specialty Exam: ? ?Presentation  ?General Appearance: Appropriate for Environment; Casual; Fairly Groomed ? ?Eye Contact:Good ? ?Speech:Normal Rate ? ?Speech Volume:Normal ? ?Handedness:Right ? ? ?Mood and Affect  ?Mood:Euthymic ? ?Affect:Appropriate; Congruent; Full Range ? ? ?Thought Process  ?Thought Processes:Linear ? ?Descriptions of Associations:Intact ? ?Orientation:Full (Time, Place and Person) ? ?Thought Content:Logical ? ?History of Schizophrenia/Schizoaffective disorder:No ? ?Duration of Psychotic Symptoms:No data recorded ?Hallucinations:Hallucinations: None ? ?Ideas of Reference:None ? ?Suicidal Thoughts:Suicidal Thoughts: No ? ?Homicidal Thoughts:Homicidal Thoughts: No ? ? ?Sensorium   ?Memory:Immediate Good; Recent Good; Remote Good ? ?Judgment:Good ? ?Insight:Good ? ? ?Executive Functions  ?Concentration:Good ? ?Attention Span:Good ? ?Recall:Fair ? ?Fund of Knowledge:Fair ? ?Language:Fair ? ? ?Psychomotor Activity  ?Psychomotor Activity:Psychomotor Activity: Normal ? ? ?Assets  ?Assets:Communication Skills; Desire for Improvement; Housing; Physical Health; Social Support ? ? ?Sleep  ?Sleep:Sleep: Good ? ? ? ?Physical Exam: ?Physical Exam ?Constitutional:   ?   Appearance: Normal appearance.  ?HENT:  ?   Head: Normocephalic and atraumatic.  ?Pulmonary:  ?   Effort: Pulmonary effort is normal.  ?Neurological:  ?   Mental Status: He is alert and oriented to person, place, and time.  ? ?Review of Systems  ?Psychiatric/Behavioral:  Negative for hallucinations and suicidal ideas. The patient does not have insomnia.   ?Blood pressure 138/75, pulse 70, temperature 97.7 ?F (36.5 ?C), resp. rate 18, height 6' (1.829 m), weight 95.3 kg, SpO2 97 %. Body mass index is 28.48 kg/m?. ? ? ?Social History  ? ?Tobacco Use  ?Smoking Status Some Days  ? Types: Cigarettes  ?Smokeless Tobacco Never  ? ?Tobacco Cessation:  A prescription for an FDA-approved tobacco cessation medication provided at discharge ? ? ?Blood Alcohol level:  ?Lab Results  ?Component Value Date  ? ETH <10 09/18/2021  ? ETH <10 03/24/2021  ? ? ?Metabolic Disorder Labs:  ?Lab  Results  ?Component Value Date  ? HGBA1C 5.3 10/05/2021  ? MPG 105 10/05/2021  ? MPG 96.8 03/27/2021  ? ?No results found for: PROLACTIN ?Lab Results  ?Component Value Date  ? CHOL 124 10/05/2021  ? TRIG 68 10/05/2021  ? HDL 35 (L) 10/05/2021  ? CHOLHDL 3.5 10/05/2021  ? VLDL 14 10/05/2021  ? LDLCALC 75 10/05/2021  ? LDLCALC 87 03/27/2021  ? ? ?See Psychiatric Specialty Exam and Suicide Risk Assessment completed by Attending Physician prior to discharge. ? ?Discharge destination:  Home ? ?Is patient on multiple antipsychotic therapies at discharge:  No   ?Has Patient had  three or more failed trials of antipsychotic monotherapy by history:  No ? ?Recommended Plan for Multiple Antipsychotic Therapies: ?NA ? ?Discharge Instructions   ? ? Diet - low sodium heart healthy   Complet

## 2021-12-16 ENCOUNTER — Encounter (HOSPITAL_BASED_OUTPATIENT_CLINIC_OR_DEPARTMENT_OTHER): Payer: Self-pay | Admitting: Emergency Medicine

## 2021-12-16 ENCOUNTER — Emergency Department (HOSPITAL_BASED_OUTPATIENT_CLINIC_OR_DEPARTMENT_OTHER): Payer: BC Managed Care – PPO

## 2021-12-16 ENCOUNTER — Other Ambulatory Visit: Payer: Self-pay

## 2021-12-16 ENCOUNTER — Emergency Department (HOSPITAL_BASED_OUTPATIENT_CLINIC_OR_DEPARTMENT_OTHER)
Admission: EM | Admit: 2021-12-16 | Discharge: 2021-12-16 | Disposition: A | Discharge status: Home / Self Care | Payer: BC Managed Care – PPO | Attending: Emergency Medicine | Admitting: Emergency Medicine

## 2021-12-16 DIAGNOSIS — M545 Low back pain, unspecified: Secondary | ICD-10-CM

## 2021-12-16 DIAGNOSIS — M533 Sacrococcygeal disorders, not elsewhere classified: Secondary | ICD-10-CM | POA: Diagnosis not present

## 2021-12-16 DIAGNOSIS — T7621XA Adult sexual abuse, suspected, initial encounter: Secondary | ICD-10-CM | POA: Diagnosis present

## 2021-12-16 LAB — URINALYSIS, ROUTINE W REFLEX MICROSCOPIC
Glucose, UA: NEGATIVE mg/dL
Hgb urine dipstick: NEGATIVE
Ketones, ur: NEGATIVE mg/dL
Leukocytes,Ua: NEGATIVE
Nitrite: NEGATIVE
Protein, ur: NEGATIVE mg/dL
Specific Gravity, Urine: 1.025 (ref 1.005–1.030)
pH: 6.5 (ref 5.0–8.0)

## 2021-12-16 NOTE — Discharge Instructions (Addendum)
You may take tylenol or ibuprofen for your pain.  You will be called with the results of your STD screen.  If positive you will need to see

## 2021-12-16 NOTE — ED Notes (Signed)
Patient transported to X-ray 

## 2021-12-16 NOTE — ED Notes (Signed)
PA rounding at bedside 
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
## 2021-12-16 NOTE — ED Notes (Signed)
Patient verbalizes understanding of discharge instructions. Opportunity for questioning and answers were provided. Armband removed by staff, pt discharged from ED. Ambulated out to lobby  
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
## 2021-12-17 ENCOUNTER — Emergency Department
Admission: EM | Admit: 2021-12-17 | Discharge: 2021-12-17 | Disposition: A | Discharge status: Home / Self Care | Payer: BC Managed Care – PPO | Attending: Emergency Medicine | Admitting: Emergency Medicine

## 2021-12-17 ENCOUNTER — Other Ambulatory Visit: Payer: Self-pay

## 2021-12-17 DIAGNOSIS — I10 Essential (primary) hypertension: Secondary | ICD-10-CM | POA: Diagnosis not present

## 2021-12-17 DIAGNOSIS — E860 Dehydration: Secondary | ICD-10-CM | POA: Diagnosis not present

## 2021-12-17 DIAGNOSIS — T7621XA Adult sexual abuse, suspected, initial encounter: Secondary | ICD-10-CM | POA: Diagnosis present

## 2021-12-17 DIAGNOSIS — Z20822 Contact with and (suspected) exposure to covid-19: Secondary | ICD-10-CM | POA: Insufficient documentation

## 2021-12-17 DIAGNOSIS — F1210 Cannabis abuse, uncomplicated: Secondary | ICD-10-CM | POA: Insufficient documentation

## 2021-12-17 DIAGNOSIS — F1510 Other stimulant abuse, uncomplicated: Secondary | ICD-10-CM | POA: Insufficient documentation

## 2021-12-17 DIAGNOSIS — F1910 Other psychoactive substance abuse, uncomplicated: Secondary | ICD-10-CM

## 2021-12-17 LAB — URINALYSIS, ROUTINE W REFLEX MICROSCOPIC
Bacteria, UA: NONE SEEN
Bilirubin Urine: NEGATIVE
Glucose, UA: NEGATIVE mg/dL
Hgb urine dipstick: NEGATIVE
Ketones, ur: 5 mg/dL — AB
Leukocytes,Ua: NEGATIVE
Nitrite: NEGATIVE
Protein, ur: 30 mg/dL — AB
Specific Gravity, Urine: 1.035 — ABNORMAL HIGH (ref 1.005–1.030)
pH: 5 (ref 5.0–8.0)

## 2021-12-17 LAB — CBC
HCT: 46.7 % (ref 39.0–52.0)
Hemoglobin: 16 g/dL (ref 13.0–17.0)
MCH: 30.3 pg (ref 26.0–34.0)
MCHC: 34.3 g/dL (ref 30.0–36.0)
MCV: 88.4 fL (ref 80.0–100.0)
Platelets: 208 10*3/uL (ref 150–400)
RBC: 5.28 MIL/uL (ref 4.22–5.81)
RDW: 13 % (ref 11.5–15.5)
WBC: 5.3 10*3/uL (ref 4.0–10.5)
nRBC: 0 % (ref 0.0–0.2)

## 2021-12-17 LAB — GROUP A STREP BY PCR: Group A Strep by PCR: NOT DETECTED

## 2021-12-17 LAB — COMPREHENSIVE METABOLIC PANEL
ALT: 21 U/L (ref 0–44)
AST: 21 U/L (ref 15–41)
Albumin: 4.8 g/dL (ref 3.5–5.0)
Alkaline Phosphatase: 69 U/L (ref 38–126)
Anion gap: 9 (ref 5–15)
BUN: 11 mg/dL (ref 6–20)
CO2: 25 mmol/L (ref 22–32)
Calcium: 9.6 mg/dL (ref 8.9–10.3)
Chloride: 104 mmol/L (ref 98–111)
Creatinine, Ser: 0.95 mg/dL (ref 0.61–1.24)
GFR, Estimated: >60 mL/min (ref >60)
Glucose, Bld: 109 mg/dL — ABNORMAL HIGH (ref 70–99)
Potassium: 3.2 mmol/L — ABNORMAL LOW (ref 3.5–5.1)
Sodium: 138 mmol/L (ref 135–145)
Total Bilirubin: 1.4 mg/dL — ABNORMAL HIGH (ref 0.3–1.2)
Total Protein: 7.7 g/dL (ref 6.5–8.1)

## 2021-12-17 LAB — URINE DRUG SCREEN, QUALITATIVE (ARMC ONLY)
Amphetamines, Ur Screen: POSITIVE — AB
Barbiturates, Ur Screen: NOT DETECTED
Benzodiazepine, Ur Scrn: NOT DETECTED
Cannabinoid 50 Ng, Ur ~~LOC~~: POSITIVE — AB
Cocaine Metabolite,Ur ~~LOC~~: NOT DETECTED
MDMA (Ecstasy)Ur Screen: NOT DETECTED
Methadone Scn, Ur: NOT DETECTED
Opiate, Ur Screen: NOT DETECTED
Phencyclidine (PCP) Ur S: NOT DETECTED
Tricyclic, Ur Screen: NOT DETECTED

## 2021-12-17 LAB — SARS CORONAVIRUS 2 BY RT PCR: SARS Coronavirus 2 by RT PCR: NEGATIVE

## 2021-12-17 LAB — CHLAMYDIA/NGC RT PCR (ARMC ONLY)
Chlamydia Tr: NOT DETECTED
Chlamydia Tr: NOT DETECTED
N gonorrhoeae: NOT DETECTED
N gonorrhoeae: NOT DETECTED

## 2021-12-17 LAB — ACETAMINOPHEN LEVEL: Acetaminophen (Tylenol), Serum: <10 ug/mL — ABNORMAL LOW (ref 10–30)

## 2021-12-17 LAB — ETHANOL: Alcohol, Ethyl (B): <10 mg/dL (ref <10)

## 2021-12-17 LAB — HIV ANTIBODY (ROUTINE TESTING W REFLEX): HIV Screen 4th Generation wRfx: NONREACTIVE

## 2021-12-17 LAB — SALICYLATE LEVEL: Salicylate Lvl: <7 mg/dL — ABNORMAL LOW (ref 7.0–30.0)

## 2021-12-17 LAB — RPR: RPR Ser Ql: NONREACTIVE
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
## 2021-12-17 NOTE — Discharge Instructions (Addendum)
Your drug test was positive for methamphetamine and marijuana byproducts.  Your labs also show signs of dehydration.  Drink lots of fluids to hydrate.  Your strep throat and COVID test were negative.  The throat swab for sexually transmitted infections will result tomorrow and we will call you within the next few days if there are abnormal results.

## 2021-12-18 ENCOUNTER — Ambulatory Visit (HOSPITAL_COMMUNITY)
Admission: EM | Admit: 2021-12-18 | Discharge: 2021-12-18 | Disposition: A | Discharge status: Home / Self Care | Payer: BC Managed Care – PPO

## 2021-12-18 ENCOUNTER — Other Ambulatory Visit (INDEPENDENT_AMBULATORY_CARE_PROVIDER_SITE_OTHER)
Admission: EM | Admit: 2021-12-18 | Discharge: 2021-12-20 | Disposition: A | Discharge status: Other Acute Inpatient Hospital | Payer: BC Managed Care – PPO | Source: Home / Self Care | Attending: Psychiatry | Admitting: Psychiatry

## 2021-12-18 DIAGNOSIS — F429 Obsessive-compulsive disorder, unspecified: Secondary | ICD-10-CM | POA: Insufficient documentation

## 2021-12-18 DIAGNOSIS — Z79899 Other long term (current) drug therapy: Secondary | ICD-10-CM | POA: Insufficient documentation

## 2021-12-18 DIAGNOSIS — Z9151 Personal history of suicidal behavior: Secondary | ICD-10-CM | POA: Insufficient documentation

## 2021-12-18 DIAGNOSIS — F19959 Other psychoactive substance use, unspecified with psychoactive substance-induced psychotic disorder, unspecified: Secondary | ICD-10-CM

## 2021-12-18 DIAGNOSIS — F332 Major depressive disorder, recurrent severe without psychotic features: Secondary | ICD-10-CM | POA: Insufficient documentation

## 2021-12-18 DIAGNOSIS — F1990 Other psychoactive substance use, unspecified, uncomplicated: Secondary | ICD-10-CM

## 2021-12-18 DIAGNOSIS — Z20822 Contact with and (suspected) exposure to covid-19: Secondary | ICD-10-CM | POA: Insufficient documentation

## 2021-12-18 DIAGNOSIS — F4310 Post-traumatic stress disorder, unspecified: Secondary | ICD-10-CM | POA: Insufficient documentation

## 2021-12-18 DIAGNOSIS — F600 Paranoid personality disorder: Secondary | ICD-10-CM | POA: Diagnosis not present

## 2021-12-18 DIAGNOSIS — F1290 Cannabis use, unspecified, uncomplicated: Secondary | ICD-10-CM | POA: Insufficient documentation

## 2021-12-18 LAB — RESP PANEL BY RT-PCR (FLU A&B, COVID) ARPGX2
Influenza A by PCR: NEGATIVE
Influenza B by PCR: NEGATIVE
SARS Coronavirus 2 by RT PCR: NEGATIVE

## 2021-12-18 LAB — POCT URINE DRUG SCREEN - MANUAL ENTRY (I-SCREEN)
POC Amphetamine UR: POSITIVE — AB
POC Buprenorphine (BUP): NOT DETECTED
POC Cocaine UR: NOT DETECTED
POC Marijuana UR: POSITIVE — AB
POC Methadone UR: NOT DETECTED
POC Methamphetamine UR: POSITIVE — AB
POC Morphine: NOT DETECTED
POC Oxazepam (BZO): NOT DETECTED
POC Oxycodone UR: NOT DETECTED
POC Secobarbital (BAR): NOT DETECTED

## 2021-12-18 LAB — POC SARS CORONAVIRUS 2 AG: SARSCOV2ONAVIRUS 2 AG: NEGATIVE

## 2021-12-18 MED ORDER — ACETAMINOPHEN 325 MG PO TABS: 650.0000 mg | ORAL_TABLET | Freq: Four times a day (QID) | ORAL | Status: DC | PRN

## 2021-12-18 MED ORDER — TRAZODONE HCL 50 MG PO TABS
50.0000 mg | ORAL_TABLET | Freq: Every evening | ORAL | Status: DC | PRN
  Administered 2021-12-19: 50 mg via ORAL
  Filled 2021-12-18: qty 1

## 2021-12-18 MED ORDER — HYDROXYZINE HCL 25 MG PO TABS
25.0000 mg | ORAL_TABLET | Freq: Three times a day (TID) | ORAL | Status: DC | PRN
  Administered 2021-12-19: 25 mg via ORAL
  Filled 2021-12-18: qty 1

## 2021-12-18 MED ORDER — MAGNESIUM HYDROXIDE 400 MG/5ML PO SUSP: 30.0000 mL | Freq: Every day | ORAL | Status: DC | PRN

## 2021-12-18 MED ORDER — ALUM & MAG HYDROXIDE-SIMETH 200-200-20 MG/5ML PO SUSP
30.0000 mL | ORAL | Status: DC | PRN
Start: 2021-12-18 — End: 2021-12-20
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
## 2021-12-18 NOTE — Discharge Instructions (Addendum)
  Discharge recommendations:  Patient is to take medications as prescribed. Please see information for follow-up appointment with psychiatry and therapy. Please follow up with your primary care provider for all medical related needs.  Patient can follow up with [facility] [address] [place], [place] , [place] [address] for substance abuse treatment  Therapy: We recommend that patient participate in individual therapy to address mental health concerns.  Medications: The parent/guardian is to contact a medical professional and/or outpatient provider to address any new side effects that develop. Parent/guardian should update outpatient providers of any new medications and/or medication changes.   Safety:  The patient should abstain from use of illicit substances/drugs and abuse of any medications. If symptoms worsen or do not continue to improve or if the patient becomes actively suicidal or homicidal then it is recommended that the patient return to the closest hospital emergency department, the [facility], or call 911 for further evaluation and treatment. National Suicide Prevention Lifeline 1-800-SUICIDE or 6047071770.  About 988 988 offers 24/7 access to trained crisis counselors who can help people experiencing mental health-related distress. People can call or text 988 or chat 988lifeline.org for themselves or if they are worried about a loved one who may need crisis support.

## 2021-12-18 NOTE — Progress Notes (Signed)
Pt is a 21 year old caucasian male appearing to [facility] as a walk in with his mother due to paranoia. Pt reports he was at [facility] this morning and was recommended Inpatient but refused to stay. Pt reports returning due to worsening paranoia and ready to sign in. Pt denies SI,HI, access to weapons and legal issues. Pt reports feeling paranoid and that " I do not feel safe". Pt reports taking a hallucinogen DMT  ( Tuesday-Thursday) and Meth. Pt denies a hx of Mental health/ Medications.   Determination of Need: Urgent  12/18/21 2015  [facility] Triage Screening (Walk-ins at [facility] only)  How Did You Hear About [place]? Hospital Discharge  What Is the Reason for Your Visit/Call Today? Pt reports coming to the [facility] earlier today and reports he was recommened for Inpatient and he decided to leave. He reports he is back to sign in.  How Long Has This Been Causing You Problems? <Week  Have You Recently Had Any Thoughts About Hurting Yourself? No  Are You Planning to Commit Suicide/Harm Yourself At This time? No  Have you Recently Had Thoughts About Hurting Someone [name]? No  Are You Planning To Harm Someone At This Time? No  Are you currently experiencing any auditory, visual or other hallucinations? Yes  Please explain the hallucinations you are currently experiencing: Pt reports feeling paranoid/ not feeling safe and reports that he took a hallucinogen tues-thurs of last week.  Have You Used Any Alcohol or Drugs in the Past 24 Hours? Yes  How long ago did you use Drugs or Alcohol? Pt reports using THC this morning.  What Did You Use and How Much? Pt was not  able to remember.  Do you have any current medical co-morbidities that require immediate attention? No  Clinician description of patient physical appearance/behavior: Pt appears to be paranoid, and tired.  What Do You Feel Would Help You the Most Today? Alcohol or Drug Use Treatment  If access to [facility] was not available, would you have sought care in the  Emergency Department? Yes  Determination of Need Urgent (48 hours)  Options For Referral Inpatient Hospitalization;Medication Management

## 2021-12-18 NOTE — ED Notes (Signed)
AVS reviewed with pt and mother. All questions answered and items returned from locker. Pt and mother walked out and went home by car.
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
## 2021-12-18 NOTE — ED Provider Notes (Signed)
Patient seen at [facility]; he didn't want to be registered for a walk-in assessment. He presents with his mother Burnett Kanaris). Patient is requesting resources for detox and substance abuse treatment. Patient and mother didn't want to register here at [facility]. Information for outpatient resources including [facility] provided to patient and his mother. Patient denies SI, HI. He is noted to be paranoid and delusional. He report that he abused meth on Thursday.

## 2021-12-18 NOTE — ED Notes (Signed)
Skin is warm, dry and intact. Pt has tattoos on left upper arm, stomach and chest

## 2021-12-18 NOTE — ED Notes (Signed)
2 hour covid pending and transfer to [facility]
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
## 2021-12-19 ENCOUNTER — Inpatient Hospital Stay (HOSPITAL_COMMUNITY): Admission: AD | Admit: 2021-12-19 | Payer: BC Managed Care – PPO | Source: Intra-hospital | Admitting: Psychiatry

## 2021-12-19 LAB — GC/CHLAMYDIA PROBE AMP (~~LOC~~) NOT AT ARMC
Chlamydia: NEGATIVE
Chlamydia: NEGATIVE
Comment: NEGATIVE
Comment: NORMAL
Comment: NEGATIVE
Comment: NORMAL
Neisseria Gonorrhea: NEGATIVE
Neisseria Gonorrhea: NEGATIVE

## 2021-12-19 MED ORDER — PROPRANOLOL HCL 10 MG PO TABS
10.0000 mg | ORAL_TABLET | Freq: Two times a day (BID) | ORAL | Status: DC
  Administered 2021-12-19 – 2021-12-20 (×3): 10 mg via ORAL
  Filled 2021-12-19 (×3): qty 1

## 2021-12-19 MED ORDER — FLUOXETINE HCL 20 MG PO CAPS
40.0000 mg | ORAL_CAPSULE | Freq: Every day | ORAL | Status: DC
  Administered 2021-12-20: 40 mg via ORAL
  Filled 2021-12-19 (×2): qty 2

## 2021-12-19 MED ORDER — LORAZEPAM 1 MG PO TABS
1.0000 mg | ORAL_TABLET | Freq: Once | ORAL | Status: AC
Start: 2021-12-19 — End: 2021-12-19
  Administered 2021-12-19: 1 mg via ORAL
  Filled 2021-12-19: qty 1

## 2021-12-19 MED ORDER — QUETIAPINE FUMARATE 100 MG PO TABS: 100.0000 mg | ORAL_TABLET | Freq: Every day | ORAL | 0 refills | Status: DC

## 2021-12-19 MED ORDER — PROPRANOLOL HCL 10 MG PO TABS: 10.0000 mg | ORAL_TABLET | Freq: Two times a day (BID) | ORAL | Status: DC

## 2021-12-19 MED ORDER — QUETIAPINE FUMARATE 100 MG PO TABS
100.0000 mg | ORAL_TABLET | Freq: Every day | ORAL | Status: DC
  Administered 2021-12-19: 100 mg via ORAL
  Filled 2021-12-19: qty 1

## 2021-12-19 MED ORDER — FLUOXETINE HCL 40 MG PO CAPS: 40.0000 mg | ORAL_CAPSULE | Freq: Every day | ORAL | 0 refills | Status: DC

## 2021-12-19 MED ORDER — NICOTINE POLACRILEX 2 MG MT GUM
2.0000 mg | CHEWING_GUM | OROMUCOSAL | Status: DC | PRN
  Administered 2021-12-19: 2 mg via ORAL
  Filled 2021-12-19 (×2): qty 1
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
## 2021-12-19 NOTE — BH Assessment (Signed)
LCSW Progress Note  1510 - LCSW attempted to contact pt's mother, Vela Prose at [phone] and left a message requesting a call-back.  LCSW attempted the work number provided but did not leave a Engineer, technical sales.

## 2021-12-19 NOTE — ED Notes (Addendum)
This MHT and my RN gave pt a blanket, a snack, and something to drink. He asked about more sleeping meds. Told him the RN just gave him some meds and to eat something and try and lay down. Also told him if he couldn't sleep he had until 2am to ask for something else to help him sleep.

## 2021-12-19 NOTE — ED Notes (Signed)
Did not attend group 

## 2021-12-19 NOTE — ED Notes (Signed)
Pt is in the bed sleeping. Respirations are even and unlabored. No acute distress noted. Will continue to monitor for safety. 

## 2021-12-19 NOTE — ED Notes (Signed)
Report called for pt. Pt going to [facility] adult. [facility] RN received report.

## 2021-12-19 NOTE — ED Notes (Signed)
Patient brought to unit - papers signed, oriented to unit - patient in no sxs of distress at this time - will continue to monitor for safety

## 2021-12-19 NOTE — Progress Notes (Signed)
Received William [name] this AM asleep in his bed, he was awaken for breakfast and medication. He refused breakfast and decided to only take the propranolol. He stated he take the Prozac as needed. The correct way to take the Prozac was explained to him. He stated he will take it later if needed.

## 2021-12-19 NOTE — ED Notes (Signed)
Pt refused lunch

## 2021-12-19 NOTE — ED Notes (Signed)
Patient continues to rest with no sxs of distress - will continue to monitor for safety ?
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
## 2021-12-19 NOTE — Progress Notes (Signed)
Voluntary consent form faxed to [phone]
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
## 2021-12-19 NOTE — ED Notes (Signed)
Pt denies SI,HI,AVH. Pt came to nurse and stated that he want to leave.  Provider was notified.

## 2021-12-19 NOTE — Progress Notes (Addendum)
William [name] continues to sleep in his bed without distress, he refused lunch earlier.

## 2021-12-19 NOTE — ED Notes (Signed)
Pt. Came to the nursing station saying there were bugs all in his room. I went and got them all up.

## 2021-12-19 NOTE — ED Notes (Signed)
Pt. States he wants to leave to go get drug tested to check for DMT and then he will go get help after but he can't get it out of his head that he needs to have a drug test ASAP. Nurse is giving report for [facility] for [place] I told him when she gets off the phone I'll let her know.

## 2021-12-19 NOTE — ED Notes (Signed)
Provider came to see pt and talked to the pt. Provider Sindy Guadeloupe NP, stated that pt can leave AMA.

## 2021-12-19 NOTE — Progress Notes (Signed)
Date: 12/19/21 Time: 6:00pm  Behavioral Health Coordinator spoke with William [name]'s mom and provided her resources for residential substance abuse programs.  William [name]'s mom reports she will start calling these agencies tomorrow to get more information on admittance after discharge from [facility].    [name] [facility]

## 2021-12-19 NOTE — ED Notes (Signed)
Pt refused dinner

## 2021-12-20 ENCOUNTER — Encounter (HOSPITAL_COMMUNITY): Payer: Self-pay | Admitting: Psychiatry

## 2021-12-20 ENCOUNTER — Inpatient Hospital Stay (HOSPITAL_COMMUNITY)
Admission: AD | Admit: 2021-12-20 | Discharge: 2021-12-25 | DRG: 897 | Disposition: A | Discharge status: Home / Self Care | Payer: BC Managed Care – PPO | Source: Intra-hospital | Attending: Emergency Medicine | Admitting: Emergency Medicine

## 2021-12-20 ENCOUNTER — Other Ambulatory Visit: Payer: Self-pay

## 2021-12-20 DIAGNOSIS — F429 Obsessive-compulsive disorder, unspecified: Secondary | ICD-10-CM | POA: Diagnosis present

## 2021-12-20 DIAGNOSIS — Z880 Allergy status to penicillin: Secondary | ICD-10-CM | POA: Diagnosis not present

## 2021-12-20 DIAGNOSIS — F1510 Other stimulant abuse, uncomplicated: Secondary | ICD-10-CM

## 2021-12-20 DIAGNOSIS — Z560 Unemployment, unspecified: Secondary | ICD-10-CM

## 2021-12-20 DIAGNOSIS — F1290 Cannabis use, unspecified, uncomplicated: Secondary | ICD-10-CM | POA: Diagnosis present

## 2021-12-20 DIAGNOSIS — Z9151 Personal history of suicidal behavior: Secondary | ICD-10-CM | POA: Diagnosis not present

## 2021-12-20 DIAGNOSIS — F1590 Other stimulant use, unspecified, uncomplicated: Secondary | ICD-10-CM

## 2021-12-20 DIAGNOSIS — Z818 Family history of other mental and behavioral disorders: Secondary | ICD-10-CM

## 2021-12-20 DIAGNOSIS — F1990 Other psychoactive substance use, unspecified, uncomplicated: Secondary | ICD-10-CM | POA: Diagnosis not present

## 2021-12-20 DIAGNOSIS — F600 Paranoid personality disorder: Secondary | ICD-10-CM | POA: Diagnosis present

## 2021-12-20 DIAGNOSIS — R1013 Epigastric pain: Secondary | ICD-10-CM

## 2021-12-20 DIAGNOSIS — Z91410 Personal history of adult physical and sexual abuse: Secondary | ICD-10-CM | POA: Diagnosis not present

## 2021-12-20 DIAGNOSIS — Z91148 Patient's other noncompliance with medication regimen for other reason: Secondary | ICD-10-CM | POA: Diagnosis not present

## 2021-12-20 DIAGNOSIS — K529 Noninfective gastroenteritis and colitis, unspecified: Secondary | ICD-10-CM | POA: Diagnosis present

## 2021-12-20 DIAGNOSIS — F4310 Post-traumatic stress disorder, unspecified: Secondary | ICD-10-CM | POA: Diagnosis present

## 2021-12-20 DIAGNOSIS — Z79899 Other long term (current) drug therapy: Secondary | ICD-10-CM

## 2021-12-20 DIAGNOSIS — Z881 Allergy status to other antibiotic agents status: Secondary | ICD-10-CM | POA: Diagnosis not present

## 2021-12-20 DIAGNOSIS — R112 Nausea with vomiting, unspecified: Secondary | ICD-10-CM

## 2021-12-20 DIAGNOSIS — Z046 Encounter for general psychiatric examination, requested by authority: Secondary | ICD-10-CM | POA: Diagnosis not present

## 2021-12-20 DIAGNOSIS — K30 Functional dyspepsia: Secondary | ICD-10-CM | POA: Diagnosis not present

## 2021-12-20 DIAGNOSIS — F19959 Other psychoactive substance use, unspecified with psychoactive substance-induced psychotic disorder, unspecified: Secondary | ICD-10-CM | POA: Diagnosis present

## 2021-12-20 DIAGNOSIS — F1210 Cannabis abuse, uncomplicated: Secondary | ICD-10-CM | POA: Diagnosis not present

## 2021-12-20 DIAGNOSIS — Z20822 Contact with and (suspected) exposure to covid-19: Secondary | ICD-10-CM | POA: Diagnosis present

## 2021-12-20 DIAGNOSIS — F332 Major depressive disorder, recurrent severe without psychotic features: Secondary | ICD-10-CM | POA: Diagnosis present

## 2021-12-20 DIAGNOSIS — F17210 Nicotine dependence, cigarettes, uncomplicated: Secondary | ICD-10-CM | POA: Diagnosis present

## 2021-12-20 DIAGNOSIS — E872 Acidosis, unspecified: Secondary | ICD-10-CM | POA: Diagnosis not present

## 2021-12-20 DIAGNOSIS — R45851 Suicidal ideations: Secondary | ICD-10-CM | POA: Diagnosis present

## 2021-12-20 DIAGNOSIS — F339 Major depressive disorder, recurrent, unspecified: Secondary | ICD-10-CM | POA: Diagnosis not present

## 2021-12-20 DIAGNOSIS — T7421XA Adult sexual abuse, confirmed, initial encounter: Secondary | ICD-10-CM | POA: Diagnosis not present

## 2021-12-20 MED ORDER — HYDROXYZINE HCL 25 MG PO TABS
25.0000 mg | ORAL_TABLET | Freq: Three times a day (TID) | ORAL | Status: DC | PRN
  Administered 2021-12-21: 25 mg via ORAL
  Filled 2021-12-20: qty 1

## 2021-12-20 MED ORDER — LORAZEPAM 1 MG PO TABS: 1.0000 mg | ORAL_TABLET | ORAL | Status: DC | PRN

## 2021-12-20 MED ORDER — OLANZAPINE 10 MG PO TBDP: 10.0000 mg | ORAL_TABLET | Freq: Three times a day (TID) | ORAL | Status: DC | PRN

## 2021-12-20 MED ORDER — MAGNESIUM HYDROXIDE 400 MG/5ML PO SUSP: 30.0000 mL | Freq: Every day | ORAL | Status: DC | PRN

## 2021-12-20 MED ORDER — ALUM & MAG HYDROXIDE-SIMETH 200-200-20 MG/5ML PO SUSP: 30.0000 mL | ORAL | Status: DC | PRN

## 2021-12-20 MED ORDER — ZIPRASIDONE MESYLATE 20 MG IM SOLR
20.0000 mg | INTRAMUSCULAR | Status: AC | PRN
  Administered 2021-12-22: 20 mg via INTRAMUSCULAR

## 2021-12-20 MED ORDER — PROPRANOLOL HCL 20 MG PO TABS
10.0000 mg | ORAL_TABLET | Freq: Two times a day (BID) | ORAL | Status: DC
  Administered 2021-12-20 – 2021-12-25 (×10): 10 mg via ORAL
  Filled 2021-12-20 (×15): qty 1

## 2021-12-20 MED ORDER — FLUOXETINE HCL 20 MG PO CAPS
40.0000 mg | ORAL_CAPSULE | Freq: Every day | ORAL | Status: DC
  Administered 2021-12-21: 40 mg via ORAL
  Filled 2021-12-20 (×3): qty 2

## 2021-12-20 MED ORDER — ACETAMINOPHEN 325 MG PO TABS
650.0000 mg | ORAL_TABLET | Freq: Four times a day (QID) | ORAL | Status: DC | PRN
  Administered 2021-12-21 – 2021-12-25 (×3): 650 mg via ORAL
  Filled 2021-12-20 (×3): qty 2

## 2021-12-20 MED ORDER — QUETIAPINE FUMARATE 100 MG PO TABS
100.0000 mg | ORAL_TABLET | Freq: Every day | ORAL | Status: DC
  Administered 2021-12-20 – 2021-12-21 (×2): 100 mg via ORAL
  Filled 2021-12-20 (×3): qty 1

## 2021-12-20 MED ORDER — TRAZODONE HCL 100 MG PO TABS
50.0000 mg | ORAL_TABLET | Freq: Every evening | ORAL | Status: DC | PRN
  Administered 2021-12-20 – 2021-12-23 (×4): 50 mg via ORAL
  Filled 2021-12-20 (×4): qty 1

## 2021-12-20 NOTE — ED Notes (Signed)
No pain or discomfort noted/ reported. Pt alert, oriented, and ambulatory. Pt took morning medications without issue and returned back to sleep. Breathing is even and  unlabored.  Will continue to monitor for safety.

## 2021-12-20 NOTE — ED Provider Notes (Signed)
Patient was to be admitted to [facility] yesterday; however, patient refused transportation. Patient was recommended to be admitted to [facility] as patient is currently psychotic and is needing a higher level of care than the [facility] can provide. Patient seen briefly by me this morning, patient is unable to provide explanation regarding the events of last night and remains paranoid.  Patient was IVC'd by me this morning and remains appropriate for inpatient hospitalization. Patient to be admitted to [facility] today.  Estella Husk, MD

## 2021-12-20 NOTE — Discharge Instructions (Signed)
Transfer to [facility] 

## 2021-12-20 NOTE — ED Notes (Signed)
Pt is in the bed sleeping. Respiration are even and unlabored. No acute distress noted. Will continue to monitor for safety. 

## 2021-12-20 NOTE — ED Notes (Signed)
Pt stated he does not want to leave again. AC is speaking with pt.

## 2021-12-20 NOTE — ED Notes (Signed)
William [name] was offered breakfast. He said he would come out and eat later.

## 2021-12-20 NOTE — Group Note (Signed)
Group Topic: Communication  Group Date: 12/20/2021 Start Time: 0800 End Time: 0830 Facilitators: Emmit Pomfret D, NT  Department: [facility]  Number of Participants: 3  Group Focus: check in, co-dependency, communication, coping skills, and daily focus Treatment Modality:  Psychoeducation Interventions utilized were leisure development, problem solving, and support Purpose: enhance coping skills, express feelings, increase insight, regain self-worth, and reinforce self-care  Name: William [name] Date of Birth: 2000-11-16  MR: [id number]    Level of Participation: moderate Quality of Participation: cooperative Interactions with others: gave feedback Mood/Affect: appropriate Triggers (if applicable): n/a Cognition: coherent/clear Progress: Moderate Response: n/a Plan: follow-up needed  Patients Problems:  Patient Active Problem List   Diagnosis Date Noted   Psychoactive substance-induced psychosis (HCC) 12/18/2021   MDD (major depressive disorder), recurrent episode, severe (HCC) 10/04/2021   MDD (major depressive disorder) 10/04/2021   Marijuana use 03/26/2021   MDD (major depressive disorder), recurrent severe, without psychosis (HCC) 03/25/2021

## 2021-12-20 NOTE — ED Notes (Signed)
Report called to [facility].  Call placed to [employer] to arrange IVC paper service/transport.

## 2021-12-20 NOTE — Tx Team (Signed)
Initial Treatment Plan 12/20/2021 2:58 PM William [name] DGL:[id number]    PATIENT STRESSORS: Legal issue   Medication change or noncompliance   Substance abuse     PATIENT STRENGTHS: Motivation for treatment/growth  Supportive family/friends    PATIENT IDENTIFIED PROBLEMS: Psychosis  Substance Abuse    Anxiety  Hallucination               DISCHARGE CRITERIA:  Ability to meet basic life and health needs Adequate post-discharge living arrangements Motivation to continue treatment in a less acute level of care  PRELIMINARY DISCHARGE PLAN: Outpatient therapy Return to previous living arrangement  PATIENT/FAMILY INVOLVEMENT: This treatment plan has been presented to and reviewed with the patient, William [name], and/or family member.  The patient and family have been given the opportunity to ask questions and make suggestions.  Clarene Critchley, RN 12/20/2021, 2:58 PM

## 2021-12-20 NOTE — ED Notes (Signed)
Nurse called [facility] to let them know that pt refused to go to [facility].
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
## 2021-12-20 NOTE — Progress Notes (Addendum)
t

## 2021-12-21 ENCOUNTER — Encounter (HOSPITAL_COMMUNITY): Payer: Self-pay

## 2021-12-21 DIAGNOSIS — F1510 Other stimulant abuse, uncomplicated: Secondary | ICD-10-CM

## 2021-12-21 MED ORDER — ESCITALOPRAM OXALATE 10 MG PO TABS
10.0000 mg | ORAL_TABLET | Freq: Every day | ORAL | Status: DC
  Administered 2021-12-22 – 2021-12-25 (×4): 10 mg via ORAL
  Filled 2021-12-21 (×6): qty 1

## 2021-12-21 NOTE — Progress Notes (Signed)
   12/20/21 2100  Psych Admission Type (Psych Patients Only)  Admission Status Involuntary  Psychosocial Assessment  Patient Complaints Substance abuse;Anxiety;Depression  Eye Contact Fair  Facial Expression Anxious  Affect Anxious;Depressed  Speech Logical/coherent  Interaction Assertive  Motor Activity Fidgety  Appearance/Hygiene Disheveled  Behavior Characteristics Cooperative;Anxious  Mood Anxious;Depressed  Thought Process  Coherency Circumstantial  Content Blaming others  Delusions Paranoid;Persecutory  Perception WDL  Hallucination None reported or observed  Judgment Impaired  Confusion None  Danger to Self  Current suicidal ideation? Denies  Danger to Others  Danger to Others None reported or observed

## 2021-12-21 NOTE — Progress Notes (Signed)
Pt came out his room and sat by the locked doors entering the 500 hall. Pt stated he wanted to leave. Pt stated he signed a paper to come over here. Pt was informed that he did sign the paper to come over here, but refused to come to [facility] and was IVC'd. Pt was informed that the only person that can D/C pt is the doctor. Pt was informed he slept all day and was going to have hard time sleeping at night. Pt was encouraged to relax since pt took HS medications and PRNs per [facility] to help him sleep.

## 2021-12-21 NOTE — Group Note (Signed)
Recreation Therapy Group Note   Group Topic:Leisure Education  Group Date: 12/21/2021 Start Time: 1008 End Time: 1045 Facilitators: Caroll Rancher, LRT,CTRS Location: 500 Hall Dayroom   Goal Area(s) Addresses:  Patient will successfully identify positive leisure and recreation activities.  Patient will acknowledge benefits of participation in healthy leisure activities post discharge.  Patient will actively work with peers toward a shared goal.   Group Description:  Pictionary. In groups of 5-7, patients took turns trying to guess the picture being drawn on the board by their teammate.  If the team guessed the correct answer, they won a point.  If the team guessed wrong, the other team got a chance to steal the point. After several rounds of game play, the team with the most points were declared winners. Post-activity discussion reviewed benefits of positive recreation outlets: reducing stress, improving coping mechanisms, increasing self-esteem, and building larger support systems.   Affect/Mood: N/A   Participation Level: Did not attend    Clinical Observations/Individualized Feedback:     Plan: Continue to engage patient in RT group sessions 2-3x/week.   Caroll Rancher, LRT,CTRS 12/21/2021 1:44 PM
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
## 2021-12-21 NOTE — Progress Notes (Signed)
Pt refused HS medication

## 2021-12-21 NOTE — Progress Notes (Signed)
Psychoeducational Group Note  Date:  12/21/2021 Time:  2028  Group Topic/Focus:  Wrap-Up Group:   The focus of this group is to help patients review their daily goal of treatment and discuss progress on daily workbooks.  Participation Level: Did Not Attend  Participation Quality:  Not Applicable  Affect:  Not Applicable  Cognitive:  Not Applicable  Insight:  Not Applicable  Engagement in Group: Not Applicable  Additional Comments:  The patient did not attend group this evening.   Hazle Coca S 12/21/2021, 8:28 PM
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
## 2021-12-21 NOTE — Group Note (Signed)
LCSW Group Therapy  Type of Therapy and Topic:  Group Therapy: Thoughts, Feelings, and Actions  Participation Level:  Did Not Attend   Description of Group:   In this group, each patient discussed their previous experiencing and understanding of overthinking, identifying the harmful impact on their lives. As a group, each patient was introduced to the basic concepts of Cognitive Behavioral Therapy: that thoughts, feelings, and actions are all connected and influence one another. They were given examples of how overthinking can affect our feelings, actions, and vise versa. The group was then asked to analyze how overthinking was harmful and brainstorm alternative thinking patterns/reactions to the example situation. Then, each group member filled out and identified their own example situation in which a problem situation caused their thoughts, feelings, and actions to be negatively impacted; they were asked to come up with 3 new (more adaptive/positive) thoughts that led to 3 new feelings and actions.  Therapeutic Goals: Patients will review and discuss their past experience with overthinking. Patients will learn the basics of the CBT model through group-led examples.. Patients will identify situations where they may have negative thoughts, feelings, or actions and will then reframe the situation using more positive thoughts to react differently.  Summary of Patient Progress:  Did not attend  Therapeutic Modalities:   Cognitive Behavioral Therapy Mindfulness  Beatris Si, LCSW 12/21/2021  2:01 PM

## 2021-12-21 NOTE — Progress Notes (Signed)
   12/21/21 2000  Psych Admission Type (Psych Patients Only)  Admission Status Involuntary  Psychosocial Assessment  Patient Complaints Substance abuse;Anxiety;Irritability  Eye Contact Fair  Facial Expression Anxious  Affect Anxious  Speech Logical/coherent  Interaction Isolative  Motor Activity Slow  Appearance/Hygiene Disheveled  Behavior Characteristics Unable to participate  Mood Depressed  Thought Process  Coherency Circumstantial  Content Blaming others  Delusions WDL  Perception WDL  Hallucination None reported or observed  Judgment Impaired  Confusion None  Danger to Self  Current suicidal ideation? Denies  Danger to Others  Danger to Others None reported or observed

## 2021-12-21 NOTE — Progress Notes (Signed)
Pt came later to take the HS medication
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
## 2021-12-21 NOTE — BH IP Treatment Plan (Signed)
Interdisciplinary Treatment and Diagnostic Plan Update  12/21/2021 Time of Session: 10:10am  HAIDAN NHAN MRN: [id number]  Principal Diagnosis: Substance-induced psychotic disorder [facility])  Secondary Diagnoses: Principal Problem:   Substance-induced psychotic disorder ([place])   Current Medications:  Current Facility-Administered Medications  Medication Dose Route Frequency Provider Last Rate Last Admin   acetaminophen (TYLENOL) tablet 650 mg  650 mg Oral Q6H PRN Ival Bible, MD   650 mg at 12/21/21 0811   alum & mag hydroxide-simeth (MAALOX/MYLANTA) 200-200-20 MG/5ML suspension 30 mL  30 mL Oral Q4H PRN Ival Bible, MD       FLUoxetine (PROZAC) capsule 40 mg  40 mg Oral Daily Ival Bible, MD   40 mg at 12/21/21 9449   hydrOXYzine (ATARAX) tablet 25 mg  25 mg Oral TID PRN Ival Bible, MD       OLANZapine zydis (ZYPREXA) disintegrating tablet 10 mg  10 mg Oral Q8H PRN Ival Bible, MD       And   LORazepam (ATIVAN) tablet 1 mg  1 mg Oral PRN Ival Bible, MD       And   ziprasidone (GEODON) injection 20 mg  20 mg Intramuscular PRN Ival Bible, MD       magnesium hydroxide (MILK OF MAGNESIA) suspension 30 mL  30 mL Oral Daily PRN Ival Bible, MD       propranolol (INDERAL) tablet 10 mg  10 mg Oral BID Ival Bible, MD   10 mg at 12/21/21 0811   QUEtiapine (SEROQUEL) tablet 100 mg  100 mg Oral QHS Ival Bible, MD   100 mg at 12/20/21 2039   traZODone (DESYREL) tablet 50 mg  50 mg Oral QHS PRN Ival Bible, MD   50 mg at 12/20/21 2039   PTA Medications: Medications Prior to Admission  Medication Sig Dispense Refill Last Dose   FLUoxetine (PROZAC) 40 MG capsule Take 1 capsule (40 mg total) by mouth daily. 30 capsule 0    propranolol (INDERAL) 10 MG tablet Take 1 tablet (10 mg total) by mouth 2 (two) times daily.      QUEtiapine (SEROQUEL) 100 MG tablet Take 1 tablet (100 mg total) by mouth at  bedtime. 30 tablet 0     Patient Stressors: Legal issue   Medication change or noncompliance   Substance abuse    Patient Strengths: Motivation for treatment/growth  Supportive family/friends   Treatment Modalities: Medication Management, Group therapy, Case management,  1 to 1 session with clinician, Psychoeducation, Recreational therapy.   Physician Treatment Plan for Primary Diagnosis: Substance-induced psychotic disorder ([place]) Long Term Goal(s):     Short Term Goals:    Medication Management: Evaluate patient's response, side effects, and tolerance of medication regimen.  Therapeutic Interventions: 1 to 1 sessions, Unit Group sessions and Medication administration.  Evaluation of Outcomes: Not Met  Physician Treatment Plan for Secondary Diagnosis: Principal Problem:   Substance-induced psychotic disorder ([place])  Long Term Goal(s):     Short Term Goals:       Medication Management: Evaluate patient's response, side effects, and tolerance of medication regimen.  Therapeutic Interventions: 1 to 1 sessions, Unit Group sessions and Medication administration.  Evaluation of Outcomes: Not Met   RN Treatment Plan for Primary Diagnosis: Substance-induced psychotic disorder ([place]) Long Term Goal(s): Knowledge of disease and therapeutic regimen to maintain health will improve  Short Term Goals: Ability to remain free from injury will improve, Ability to participate in decision  making will improve, Ability to verbalize feelings will improve, Ability to disclose and discuss suicidal ideas, and Ability to identify and develop effective coping behaviors will improve  Medication Management: RN will administer medications as ordered by provider, will assess and evaluate patient's response and provide education to patient for prescribed medication. RN will report any adverse and/or side effects to prescribing provider.  Therapeutic Interventions: 1 on 1 counseling sessions,  Psychoeducation, Medication administration, Evaluate responses to treatment, Monitor vital signs and CBGs as ordered, Perform/monitor CIWA, COWS, AIMS and Fall Risk screenings as ordered, Perform wound care treatments as ordered.  Evaluation of Outcomes: Not Met   LCSW Treatment Plan for Primary Diagnosis: Substance-induced psychotic disorder [facility]) Long Term Goal(s): Safe transition to appropriate next level of care at discharge, Engage patient in therapeutic group addressing interpersonal concerns.  Short Term Goals: Engage patient in aftercare planning with referrals and resources, Increase social support, Increase emotional regulation, Facilitate acceptance of mental health diagnosis and concerns, Identify triggers associated with mental health/substance abuse issues, and Increase skills for wellness and recovery  Therapeutic Interventions: Assess for all discharge needs, 1 to 1 time with Social worker, Explore available resources and support systems, Assess for adequacy in community support network, Educate family and significant other(s) on suicide prevention, Complete Psychosocial Assessment, Interpersonal group therapy.  Evaluation of Outcomes: Not Met   Progress in Treatment: Attending groups: Yes. Participating in groups: Yes. Taking medication as prescribed: Yes. Toleration medication: Yes. Family/Significant other contact made: Yes, individual(s) contacted:  If consents are provided Patient understands diagnosis: No. Discussing patient identified problems/goals with staff: Yes. Medical problems stabilized or resolved: Yes. Denies suicidal/homicidal ideation: Yes. Issues/concerns per patient self-inventory: No.   New problem(s) identified: No, Describe:  None   New Short Term/Long Term Goal(s): medication stabilization, elimination of SI thoughts, development of comprehensive mental wellness plan.   Patient Goals: "To have peace of mind"   Discharge Plan or Barriers:  Patient recently admitted. CSW will continue to follow and assess for appropriate referrals and possible discharge planning.   Reason for Continuation of Hospitalization: Anxiety Delusions  Depression Medication stabilization  Estimated Length of Stay: 3 to 7 days   Last [place] Suicide Severity Risk Score: [place] Admission (Current) from 12/20/2021 in [place] [facility] Most recent reading at 12/20/2021  2:33 PM ED from 12/18/2021 in [facility] Most recent reading at 12/18/2021  9:52 PM ED from 12/18/2021 in [facility] Most recent reading at 12/18/2021  2:24 AM  C-SSRS RISK CATEGORY No Risk No Risk No Risk       Last PHQ 2/9 Scores:    12/20/2021   12:42 PM 12/18/2021   10:32 PM  Depression screen PHQ 2/9  Decreased Interest 1 3  Down, Depressed, Hopeless 1 3  PHQ - 2 Score 2 6  Altered sleeping 1 3  Tired, decreased energy 2 3  Change in appetite 0 3  Feeling bad or failure about yourself  3 1  Trouble concentrating 1 1  Moving slowly or fidgety/restless 0 1  Suicidal thoughts 1 2  PHQ-9 Score 10 20  Difficult doing work/chores Somewhat difficult Very difficult    Scribe for Treatment Team: Darleen Crocker, Latanya Presser 12/21/2021 11:34 AM

## 2021-12-21 NOTE — Progress Notes (Signed)
   12/21/21 1000  Psych Admission Type (Psych Patients Only)  Admission Status Involuntary  Psychosocial Assessment  Patient Complaints Substance abuse;Anxiety;Depression  Eye Contact Fair  Facial Expression Anxious  Affect Anxious;Depressed  Speech Logical/coherent  Interaction Assertive  Motor Activity Fidgety  Appearance/Hygiene Disheveled  Behavior Characteristics Cooperative;Anxious  Mood Anxious;Depressed  Thought Process  Coherency Circumstantial  Content Blaming others  Delusions Paranoid;Persecutory  Perception WDL  Hallucination None reported or observed  Judgment Impaired  Confusion None  Danger to Self  Current suicidal ideation? Denies  Danger to Others  Danger to Others None reported or observed
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
## 2021-12-22 DIAGNOSIS — F1210 Cannabis abuse, uncomplicated: Secondary | ICD-10-CM | POA: Diagnosis present

## 2021-12-22 LAB — BASIC METABOLIC PANEL
Anion gap: 7 (ref 5–15)
BUN: 13 mg/dL (ref 6–20)
CO2: 27 mmol/L (ref 22–32)
Calcium: 9.3 mg/dL (ref 8.9–10.3)
Chloride: 108 mmol/L (ref 98–111)
Creatinine, Ser: 0.97 mg/dL (ref 0.61–1.24)
GFR, Estimated: >60 mL/min (ref >60)
Glucose, Bld: 123 mg/dL — ABNORMAL HIGH (ref 70–99)
Potassium: 3.6 mmol/L (ref 3.5–5.1)
Sodium: 142 mmol/L (ref 135–145)

## 2021-12-22 LAB — LIPID PANEL
Cholesterol: 124 mg/dL (ref 0–200)
HDL: 25 mg/dL — ABNORMAL LOW (ref >40)
LDL Cholesterol: 82 mg/dL (ref 0–99)
Total CHOL/HDL Ratio: 5 RATIO
Triglycerides: 84 mg/dL (ref <150)
VLDL: 17 mg/dL (ref 0–40)

## 2021-12-22 LAB — TSH: TSH: 3.353 u[IU]/mL (ref 0.350–4.500)

## 2021-12-22 LAB — HEMOGLOBIN A1C
Hgb A1c MFr Bld: 4.8 % (ref 4.8–5.6)
Mean Plasma Glucose: 91.06 mg/dL

## 2021-12-22 MED ORDER — THIAMINE HCL 100 MG PO TABS
100.0000 mg | ORAL_TABLET | Freq: Every day | ORAL | Status: DC
  Administered 2021-12-23 – 2021-12-24 (×2): 100 mg via ORAL
  Filled 2021-12-22 (×5): qty 1

## 2021-12-22 MED ORDER — HALOPERIDOL LACTATE 5 MG/ML IJ SOLN
5.0000 mg | Freq: Once | INTRAMUSCULAR | Status: AC
  Administered 2021-12-22: 5 mg via INTRAMUSCULAR

## 2021-12-22 MED ORDER — LORAZEPAM 2 MG/ML IJ SOLN
2.0000 mg | Freq: Once | INTRAMUSCULAR | Status: AC
  Administered 2021-12-22: 2 mg via INTRAMUSCULAR

## 2021-12-22 MED ORDER — LORAZEPAM 2 MG/ML IJ SOLN: 2.0000 mg | Freq: Four times a day (QID) | INTRAMUSCULAR | Status: DC | PRN

## 2021-12-22 MED ORDER — LORAZEPAM 1 MG PO TABS: 1.0000 mg | ORAL_TABLET | Freq: Four times a day (QID) | ORAL | Status: DC | PRN

## 2021-12-22 MED ORDER — OLANZAPINE 10 MG PO TBDP
10.0000 mg | ORAL_TABLET | Freq: Every day | ORAL | Status: DC
  Administered 2021-12-22: 10 mg via ORAL
  Filled 2021-12-22 (×2): qty 1
  Filled 2021-12-22 (×2): qty 7

## 2021-12-22 MED ORDER — DIPHENHYDRAMINE HCL 50 MG/ML IJ SOLN: 50.0000 mg | Freq: Four times a day (QID) | INTRAMUSCULAR | Status: DC | PRN

## 2021-12-22 MED ORDER — ZIPRASIDONE MESYLATE 20 MG IM SOLR
INTRAMUSCULAR | Status: AC
  Filled 2021-12-22: qty 20

## 2021-12-22 MED ORDER — LORAZEPAM 1 MG PO TABS
1.0000 mg | ORAL_TABLET | Freq: Four times a day (QID) | ORAL | Status: DC | PRN
  Filled 2021-12-22: qty 1

## 2021-12-22 MED ORDER — LORAZEPAM 2 MG/ML IJ SOLN
INTRAMUSCULAR | Status: AC
  Filled 2021-12-22: qty 1

## 2021-12-22 MED ORDER — LORAZEPAM 1 MG PO TABS: 2.0000 mg | ORAL_TABLET | Freq: Once | ORAL | Status: AC

## 2021-12-22 MED ORDER — HALOPERIDOL 5 MG PO TABS: 5.0000 mg | ORAL_TABLET | Freq: Four times a day (QID) | ORAL | Status: DC | PRN

## 2021-12-22 MED ORDER — HYDROXYZINE HCL 25 MG PO TABS
25.0000 mg | ORAL_TABLET | Freq: Four times a day (QID) | ORAL | Status: AC | PRN
  Administered 2021-12-22 – 2021-12-24 (×3): 25 mg via ORAL
  Filled 2021-12-22 (×2): qty 1

## 2021-12-22 MED ORDER — DIPHENHYDRAMINE HCL 25 MG PO CAPS
50.0000 mg | ORAL_CAPSULE | Freq: Four times a day (QID) | ORAL | Status: DC | PRN
  Administered 2021-12-23 – 2021-12-25 (×2): 50 mg via ORAL
  Filled 2021-12-22 (×2): qty 2

## 2021-12-22 MED ORDER — HALOPERIDOL LACTATE 5 MG/ML IJ SOLN
5.0000 mg | Freq: Four times a day (QID) | INTRAMUSCULAR | Status: DC | PRN
Start: 2021-12-22 — End: 2021-12-25

## 2021-12-22 MED ORDER — LORAZEPAM 1 MG PO TABS
1.0000 mg | ORAL_TABLET | Freq: Four times a day (QID) | ORAL | Status: DC | PRN
Start: 2021-12-22 — End: 2021-12-22

## 2021-12-22 MED ORDER — ONDANSETRON 4 MG PO TBDP
4.0000 mg | ORAL_TABLET | Freq: Four times a day (QID) | ORAL | Status: AC | PRN
  Administered 2021-12-24 – 2021-12-25 (×2): 4 mg via ORAL
  Filled 2021-12-22 (×2): qty 1

## 2021-12-22 MED ORDER — NICOTINE POLACRILEX 2 MG MT GUM
2.0000 mg | CHEWING_GUM | OROMUCOSAL | Status: DC | PRN
  Administered 2021-12-23 – 2021-12-24 (×2): 2 mg via ORAL
  Filled 2021-12-22 (×3): qty 1

## 2021-12-22 MED ORDER — HALOPERIDOL 5 MG PO TABS: 5.0000 mg | ORAL_TABLET | Freq: Once | ORAL | Status: AC

## 2021-12-22 MED ORDER — LOPERAMIDE HCL 2 MG PO CAPS: 2.0000 mg | ORAL_CAPSULE | ORAL | Status: AC | PRN

## 2021-12-22 MED ORDER — ADULT MULTIVITAMIN W/MINERALS CH
1.0000 | ORAL_TABLET | Freq: Every day | ORAL | Status: DC
  Administered 2021-12-23 – 2021-12-24 (×2): 1 via ORAL
  Filled 2021-12-22 (×6): qty 1

## 2021-12-22 MED ORDER — HALOPERIDOL LACTATE 5 MG/ML IJ SOLN
INTRAMUSCULAR | Status: AC
  Filled 2021-12-22: qty 1

## 2021-12-22 NOTE — Progress Notes (Signed)
Dar Note: Patient presents with irritable mood and affect during provider's assessment.  Patient asked provider to discharge him.  Patient followed behind them in order to walk out of the unit.  Patient was educated and instructed about discharge plan but refused to follow redirection.  Patient became verbally aggressive towards provider and STARR was activated.  Patient was given Haldol 5 mg, Ativan 2 mg and Geodon for severe agitation with good effect.  Patient is safe on the unit.

## 2021-12-22 NOTE — Progress Notes (Signed)
   12/22/21 2125  Psych Admission Type (Psych Patients Only)  Admission Status Involuntary  Psychosocial Assessment  Patient Complaints Other (Comment) (wants to leave)  Eye Contact Fair  Affect Sullen  Speech Logical/coherent  Interaction Minimal  Motor Activity Other (Comment) (wnl)  Appearance/Hygiene Unremarkable  Behavior Characteristics Cooperative  Mood Sullen  Thought Process  Coherency Concrete thinking  Content WDL  Delusions None reported or observed  Perception WDL  Hallucination None reported or observed  Judgment Impaired  Confusion None  Danger to Self  Current suicidal ideation? Denies  Self-Injurious Behavior No self-injurious ideation or behavior indicators observed or expressed   Danger to Others  Danger to Others None reported or observed   Progress note   D: Pt seen in his room. Pt denies SI, HI, AVH. Pt rates pain  3/10 as lower back pain but refused any medication for this. Pt rates anxiety  0/10 and depression  10/10 d/t the fact that he wants to leave and not be here. Pt educated on the IVC process and that his choice now is to demonstrate positive behavior and to follow the treatment plan to meet criteria for discharge. Pt shook his head in the affirmative. No other issues or concerns noted at this time.  A: Pt provided support and encouragement. Pt given scheduled medication as prescribed. PRNs as appropriate. Q15 min checks for safety.   R: Pt safe on the unit. Will continue to monitor.

## 2021-12-22 NOTE — Progress Notes (Signed)
   12/22/21 0500  Sleep  Number of Hours 7.25

## 2021-12-23 MED ORDER — OLANZAPINE 10 MG PO TBDP
10.0000 mg | ORAL_TABLET | Freq: Two times a day (BID) | ORAL | Status: DC
  Administered 2021-12-23 – 2021-12-25 (×4): 10 mg via ORAL
  Filled 2021-12-23 (×10): qty 1

## 2021-12-23 NOTE — Group Note (Signed)
[facility] LCSW Group Therapy Note  Date/Time: 12/23/2021 @ 11am  Type of Therapy and Topic:  Group Therapy:  Strengths and Qualities  Participation Level: Did not attend  Description of Group:    In this group patients will be asked to explore and define the terms strength ans qualities.  Patients will be guided to discuss their thoughts, feelings, and behaviors as to where strengths and qualities originate. Participants will then list some of their strengths and qualities related to each subject topic. This group will be process-oriented, with patients participating in exploration of their own experiences as well as giving and receiving support and challenge from other group members.  Therapeutic Goals: Patient will identify specific strengths related to their personal life. Patient will identify feelings, thoughts, and beliefs about strengths and qualities. Patient will identify ways their strengths have been used. . Patient will identify situations where they have helped others or made someone else happy. .  Summary of Patient Progress Did not attend     Therapeutic Modalities:   Cognitive Behavioral Therapy Solution Focused Therapy Motivational Interviewing Brief Therapy   Kisean Rollo, LCSW, LCAS Clincal Social Worker  [facility]

## 2021-12-23 NOTE — Plan of Care (Signed)
  Problem: Education: Goal: Emotional status will improve 12/23/2021 1334 by Guadlupe Spanish, RN Outcome: Progressing 12/23/2021 1327 by Guadlupe Spanish, RN Outcome: Progressing Goal: Mental status will improve 12/23/2021 1334 by Guadlupe Spanish, RN Outcome: Progressing 12/23/2021 1327 by Guadlupe Spanish, RN Outcome: Progressing   Problem: Education: Goal: Mental status will improve 12/23/2021 1334 by Guadlupe Spanish, RN Outcome: Progressing 12/23/2021 1327 by Guadlupe Spanish, RN Outcome: Progressing

## 2021-12-23 NOTE — Progress Notes (Signed)
   12/23/21 2000  Psych Admission Type (Psych Patients Only)  Admission Status Involuntary  Psychosocial Assessment  Patient Complaints Anxiety  Eye Contact Fair  Facial Expression Anxious  Affect Labile;Preoccupied  Speech Logical/coherent  Interaction Minimal  Motor Activity Slow  Appearance/Hygiene Unremarkable  Behavior Characteristics Cooperative;Anxious  Mood Anxious  Thought Process  Coherency Disorganized  Content Blaming others  Delusions None reported or observed  Perception WDL  Hallucination None reported or observed  Judgment Impaired  Confusion None  Danger to Self  Current suicidal ideation? Denies  Danger to Others  Danger to Others None reported or observed

## 2021-12-24 ENCOUNTER — Inpatient Hospital Stay (HOSPITAL_COMMUNITY): Payer: BC Managed Care – PPO

## 2021-12-24 ENCOUNTER — Other Ambulatory Visit: Payer: Self-pay

## 2021-12-24 ENCOUNTER — Encounter (HOSPITAL_COMMUNITY): Payer: Self-pay | Admitting: Psychiatry

## 2021-12-24 ENCOUNTER — Encounter (HOSPITAL_COMMUNITY): Payer: Self-pay

## 2021-12-24 DIAGNOSIS — F19959 Other psychoactive substance use, unspecified with psychoactive substance-induced psychotic disorder, unspecified: Principal | ICD-10-CM

## 2021-12-24 LAB — CBC WITH DIFFERENTIAL/PLATELET
Abs Immature Granulocytes: 0.12 10*3/uL — ABNORMAL HIGH (ref 0.00–0.07)
Basophils Absolute: 0 10*3/uL (ref 0.0–0.1)
Basophils Relative: 0 %
Eosinophils Absolute: 0 10*3/uL (ref 0.0–0.5)
Eosinophils Relative: 0 %
HCT: 46.9 % (ref 39.0–52.0)
Hemoglobin: 16.5 g/dL (ref 13.0–17.0)
Immature Granulocytes: 1 %
Lymphocytes Relative: 15 %
Lymphs Abs: 1.7 10*3/uL (ref 0.7–4.0)
MCH: 31.3 pg (ref 26.0–34.0)
MCHC: 35.2 g/dL (ref 30.0–36.0)
MCV: 88.8 fL (ref 80.0–100.0)
Monocytes Absolute: 0.3 10*3/uL (ref 0.1–1.0)
Monocytes Relative: 3 %
Neutro Abs: 8.8 10*3/uL — ABNORMAL HIGH (ref 1.7–7.7)
Neutrophils Relative %: 81 %
Platelets: 306 10*3/uL (ref 150–400)
RBC: 5.28 MIL/uL (ref 4.22–5.81)
RDW: 12.8 % (ref 11.5–15.5)
WBC: 11 10*3/uL — ABNORMAL HIGH (ref 4.0–10.5)
nRBC: 0 % (ref 0.0–0.2)

## 2021-12-24 LAB — COMPREHENSIVE METABOLIC PANEL
ALT: 23 U/L (ref 0–44)
AST: 21 U/L (ref 15–41)
Albumin: 4.8 g/dL (ref 3.5–5.0)
Alkaline Phosphatase: 58 U/L (ref 38–126)
Anion gap: 9 (ref 5–15)
BUN: 12 mg/dL (ref 6–20)
CO2: 26 mmol/L (ref 22–32)
Calcium: 9.9 mg/dL (ref 8.9–10.3)
Chloride: 107 mmol/L (ref 98–111)
Creatinine, Ser: 1.22 mg/dL (ref 0.61–1.24)
GFR, Estimated: >60 mL/min (ref >60)
Glucose, Bld: 135 mg/dL — ABNORMAL HIGH (ref 70–99)
Potassium: 3.8 mmol/L (ref 3.5–5.1)
Sodium: 142 mmol/L (ref 135–145)
Total Bilirubin: 1.5 mg/dL — ABNORMAL HIGH (ref 0.3–1.2)
Total Protein: 7 g/dL (ref 6.5–8.1)

## 2021-12-24 LAB — AMYLASE: Amylase: 40 U/L (ref 28–100)

## 2021-12-24 LAB — LIPASE, BLOOD: Lipase: 23 U/L (ref 11–51)

## 2021-12-24 LAB — LACTIC ACID, PLASMA: Lactic Acid, Venous: 2.1 mmol/L (ref 0.5–1.9)

## 2021-12-24 LAB — TROPONIN I (HIGH SENSITIVITY): Troponin I (High Sensitivity): <2 ng/L (ref <18)

## 2021-12-24 MED ORDER — LACTATED RINGERS IV BOLUS: 1000.0000 mL | Freq: Once | INTRAVENOUS | Status: DC

## 2021-12-24 MED ORDER — LIDOCAINE VISCOUS HCL 2 % MT SOLN
15.0000 mL | Freq: Once | OROMUCOSAL | Status: AC
  Administered 2021-12-24: 15 mL via ORAL
  Filled 2021-12-24: qty 15

## 2021-12-24 MED ORDER — ONDANSETRON HCL 4 MG/2ML IJ SOLN
4.0000 mg | Freq: Once | INTRAMUSCULAR | Status: AC
  Administered 2021-12-24: 4 mg via INTRAVENOUS
  Filled 2021-12-24: qty 2

## 2021-12-24 MED ORDER — IBUPROFEN 800 MG PO TABS
800.0000 mg | ORAL_TABLET | Freq: Once | ORAL | Status: AC
  Administered 2021-12-24: 800 mg via ORAL
  Filled 2021-12-24: qty 1

## 2021-12-24 MED ORDER — ALUM & MAG HYDROXIDE-SIMETH 200-200-20 MG/5ML PO SUSP
30.0000 mL | Freq: Once | ORAL | Status: AC
  Administered 2021-12-24: 30 mL via ORAL
  Filled 2021-12-24: qty 30

## 2021-12-24 MED ORDER — BENZTROPINE MESYLATE 0.5 MG PO TABS: 0.5000 mg | ORAL_TABLET | Freq: Two times a day (BID) | ORAL | Status: DC | PRN

## 2021-12-24 NOTE — [facility] Group Notes (Signed)
.  Psychoeducational Group Note  Date 12/24/2020 Time: 0900-1000    Goal Setting   Purpose of Group: Group Focus: affirmation, clarity of thought, and goals/reality orientation Treatment Modality:  Psychoeducation Interventions utilized were assignment, group exercise, and support  Purpose: To be able to understand and verbalize the reason for their admission to the hospital. To understand that the medication helps with their chemical imbalance but they also need to work on their choices in life. To be challenged to develop a list of 30 positives about themselves. Also introduce the concept that "feelings" are not reality.    Participation Level:  did not attend  Dione Housekeeper

## 2021-12-24 NOTE — Group Note (Signed)
LCSW Group Therapy Note  Group Date:  12/24/2021  Start Time: 1315 End Time: 1415   Type of Therapy and Topic:  Group Therapy: Underneath Anger  Participation Level:  Did Not Attend   Description of Group:   In this group, patients were provided a worksheet entitled "Underneath Anger" which helped them to learn that anger is a natural emotion that is often used to cover up other vulnerable feelings.  This was described and discussed.  The importance of recognizing triggers was explained and patients talked about their own triggers.  A number of questions were posed to help explore patients' emotions that are frequently beneath the surface.  Questions included "How could exploring emotions "beneath the surface" help you deal with anger?," "How do you show difficult emotions such as sadness, hurt or guilt?" and more.  Focus was placed on how helpful it is to recognize the underlying emotions to our anger, because working on those can lead to a more permanent solution.  Therapeutic Goals: Patients will learn that anger itself is normal and cannot be eliminated. Patients will be introduced to the concept that anger is a secondary emotion. Patients will identify possible triggers to anger as well as their own personal triggers. Patients will explore possible emotions underneath anger. Patients will be encouraged to pay attention to their triggers and underlying emotions  as a part of their personal anger management.  Summary of Patient Progress:  N/A  Therapeutic Modalities:   Cognitive Behavioral Therapy    Lynnell Chad, LCSW

## 2021-12-24 NOTE — ED Triage Notes (Signed)
Pt BIB EMS from [facility] for n/v. Pt had 10 occurrences of emesis for the day. Pt has been given PO zofran and c/o of chest pain fro vomiting.

## 2021-12-25 ENCOUNTER — Inpatient Hospital Stay (HOSPITAL_COMMUNITY)
Admission: EM | Admit: 2021-12-25 | Discharge: 2021-12-27 | DRG: 392 | Disposition: A | Discharge status: Home / Self Care | Payer: BC Managed Care – PPO | Source: Intra-hospital | Attending: Student | Admitting: Student

## 2021-12-25 ENCOUNTER — Inpatient Hospital Stay (HOSPITAL_COMMUNITY): Payer: BC Managed Care – PPO

## 2021-12-25 DIAGNOSIS — F1210 Cannabis abuse, uncomplicated: Secondary | ICD-10-CM | POA: Diagnosis not present

## 2021-12-25 DIAGNOSIS — F19959 Other psychoactive substance use, unspecified with psychoactive substance-induced psychotic disorder, unspecified: Secondary | ICD-10-CM | POA: Diagnosis not present

## 2021-12-25 DIAGNOSIS — Z91410 Personal history of adult physical and sexual abuse: Secondary | ICD-10-CM

## 2021-12-25 DIAGNOSIS — Z20822 Contact with and (suspected) exposure to covid-19: Secondary | ICD-10-CM | POA: Diagnosis present

## 2021-12-25 DIAGNOSIS — Z046 Encounter for general psychiatric examination, requested by authority: Secondary | ICD-10-CM

## 2021-12-25 DIAGNOSIS — E872 Acidosis, unspecified: Secondary | ICD-10-CM | POA: Diagnosis present

## 2021-12-25 DIAGNOSIS — F15159 Other stimulant abuse with stimulant-induced psychotic disorder, unspecified: Secondary | ICD-10-CM | POA: Diagnosis present

## 2021-12-25 DIAGNOSIS — K529 Noninfective gastroenteritis and colitis, unspecified: Secondary | ICD-10-CM

## 2021-12-25 DIAGNOSIS — K30 Functional dyspepsia: Secondary | ICD-10-CM | POA: Diagnosis not present

## 2021-12-25 DIAGNOSIS — E860 Dehydration: Secondary | ICD-10-CM | POA: Diagnosis present

## 2021-12-25 DIAGNOSIS — F329 Major depressive disorder, single episode, unspecified: Secondary | ICD-10-CM | POA: Diagnosis present

## 2021-12-25 DIAGNOSIS — Z79899 Other long term (current) drug therapy: Secondary | ICD-10-CM

## 2021-12-25 DIAGNOSIS — D72829 Elevated white blood cell count, unspecified: Secondary | ICD-10-CM | POA: Diagnosis present

## 2021-12-25 DIAGNOSIS — F339 Major depressive disorder, recurrent, unspecified: Secondary | ICD-10-CM

## 2021-12-25 DIAGNOSIS — T7421XA Adult sexual abuse, confirmed, initial encounter: Secondary | ICD-10-CM | POA: Diagnosis present

## 2021-12-25 DIAGNOSIS — R112 Nausea with vomiting, unspecified: Principal | ICD-10-CM | POA: Diagnosis present

## 2021-12-25 DIAGNOSIS — F4310 Post-traumatic stress disorder, unspecified: Secondary | ICD-10-CM | POA: Diagnosis present

## 2021-12-25 DIAGNOSIS — R17 Unspecified jaundice: Secondary | ICD-10-CM | POA: Diagnosis present

## 2021-12-25 DIAGNOSIS — I10 Essential (primary) hypertension: Secondary | ICD-10-CM | POA: Diagnosis present

## 2021-12-25 DIAGNOSIS — F1990 Other psychoactive substance use, unspecified, uncomplicated: Secondary | ICD-10-CM | POA: Diagnosis present

## 2021-12-25 DIAGNOSIS — F17210 Nicotine dependence, cigarettes, uncomplicated: Secondary | ICD-10-CM | POA: Diagnosis present

## 2021-12-25 DIAGNOSIS — R739 Hyperglycemia, unspecified: Secondary | ICD-10-CM | POA: Diagnosis present

## 2021-12-25 DIAGNOSIS — F12159 Cannabis abuse with psychotic disorder, unspecified: Secondary | ICD-10-CM | POA: Diagnosis present

## 2021-12-25 LAB — RESP PANEL BY RT-PCR (FLU A&B, COVID) ARPGX2
Influenza A by PCR: NEGATIVE
Influenza B by PCR: NEGATIVE
SARS Coronavirus 2 by RT PCR: NEGATIVE

## 2021-12-25 LAB — LACTIC ACID, PLASMA: Lactic Acid, Venous: 1.6 mmol/L (ref 0.5–1.9)

## 2021-12-25 MED ORDER — KETOROLAC TROMETHAMINE 15 MG/ML IJ SOLN: 15.0000 mg | Freq: Four times a day (QID) | INTRAMUSCULAR | Status: DC | PRN

## 2021-12-25 MED ORDER — ONDANSETRON HCL 4 MG/2ML IJ SOLN
4.0000 mg | Freq: Once | INTRAMUSCULAR | Status: AC
  Administered 2021-12-25: 4 mg via INTRAVENOUS
  Filled 2021-12-25: qty 2

## 2021-12-25 MED ORDER — DICYCLOMINE HCL 10 MG PO CAPS
10.0000 mg | ORAL_CAPSULE | Freq: Once | ORAL | Status: AC
  Administered 2021-12-25: 10 mg via ORAL
  Filled 2021-12-25: qty 1

## 2021-12-25 MED ORDER — ONDANSETRON HCL 4 MG/2ML IJ SOLN
4.0000 mg | Freq: Four times a day (QID) | INTRAMUSCULAR | Status: DC | PRN
  Administered 2021-12-26: 4 mg via INTRAVENOUS
  Filled 2021-12-25: qty 2

## 2021-12-25 MED ORDER — PROCHLORPERAZINE EDISYLATE 10 MG/2ML IJ SOLN
10.0000 mg | Freq: Once | INTRAMUSCULAR | Status: AC
  Administered 2021-12-25: 10 mg via INTRAVENOUS
  Filled 2021-12-25: qty 2

## 2021-12-25 MED ORDER — ONDANSETRON HCL 4 MG PO TABS: 4.0000 mg | ORAL_TABLET | Freq: Four times a day (QID) | ORAL | Status: DC | PRN

## 2021-12-25 MED ORDER — METOCLOPRAMIDE HCL 5 MG/ML IJ SOLN
10.0000 mg | Freq: Once | INTRAMUSCULAR | Status: AC
Start: 2021-12-25 — End: 2021-12-25
  Administered 2021-12-25: 10 mg via INTRAVENOUS
  Filled 2021-12-25: qty 2

## 2021-12-25 MED ORDER — ONDANSETRON 4 MG PO TBDP
4.0000 mg | ORAL_TABLET | Freq: Once | ORAL | Status: AC
Start: 2021-12-25 — End: 2021-12-25
  Administered 2021-12-25: 4 mg via ORAL
  Filled 2021-12-25: qty 1

## 2021-12-25 MED ORDER — DEXTROSE IN LACTATED RINGERS 5 % IV SOLN: INTRAVENOUS | Status: DC

## 2021-12-25 MED ORDER — DIPHENHYDRAMINE HCL 50 MG/ML IJ SOLN
12.5000 mg | Freq: Once | INTRAMUSCULAR | Status: AC
  Administered 2021-12-25: 12.5 mg via INTRAVENOUS
  Filled 2021-12-25: qty 1

## 2021-12-25 MED ORDER — IOHEXOL 300 MG/ML  SOLN
100.0000 mL | Freq: Once | INTRAMUSCULAR | Status: AC | PRN
  Administered 2021-12-25: 100 mL via INTRAVENOUS

## 2021-12-25 MED ORDER — SODIUM CHLORIDE 0.9 % IV BOLUS
1000.0000 mL | Freq: Once | INTRAVENOUS | Status: AC
  Administered 2021-12-25: 1000 mL via INTRAVENOUS

## 2021-12-25 NOTE — ED Notes (Signed)
Pt's mother at bedside w/ pt to visit.

## 2021-12-25 NOTE — ED Notes (Signed)
Pt actively resting at this time. BH sitter present at beside w/ pt.

## 2021-12-25 NOTE — ED Notes (Signed)
Have to discharge chart so new chart can be made for admission.

## 2021-12-26 ENCOUNTER — Encounter (HOSPITAL_COMMUNITY): Payer: Self-pay | Admitting: Internal Medicine

## 2021-12-26 DIAGNOSIS — K30 Functional dyspepsia: Secondary | ICD-10-CM | POA: Diagnosis not present

## 2021-12-26 DIAGNOSIS — E872 Acidosis, unspecified: Secondary | ICD-10-CM | POA: Diagnosis present

## 2021-12-26 DIAGNOSIS — T7421XA Adult sexual abuse, confirmed, initial encounter: Secondary | ICD-10-CM | POA: Diagnosis present

## 2021-12-26 DIAGNOSIS — I10 Essential (primary) hypertension: Secondary | ICD-10-CM | POA: Diagnosis present

## 2021-12-26 DIAGNOSIS — F4310 Post-traumatic stress disorder, unspecified: Secondary | ICD-10-CM | POA: Diagnosis present

## 2021-12-26 DIAGNOSIS — R1013 Epigastric pain: Secondary | ICD-10-CM

## 2021-12-26 DIAGNOSIS — F339 Major depressive disorder, recurrent, unspecified: Secondary | ICD-10-CM | POA: Diagnosis not present

## 2021-12-26 DIAGNOSIS — F15159 Other stimulant abuse with stimulant-induced psychotic disorder, unspecified: Secondary | ICD-10-CM | POA: Diagnosis present

## 2021-12-26 DIAGNOSIS — R17 Unspecified jaundice: Secondary | ICD-10-CM | POA: Diagnosis present

## 2021-12-26 DIAGNOSIS — F12159 Cannabis abuse with psychotic disorder, unspecified: Secondary | ICD-10-CM | POA: Diagnosis present

## 2021-12-26 DIAGNOSIS — D72829 Elevated white blood cell count, unspecified: Secondary | ICD-10-CM | POA: Diagnosis present

## 2021-12-26 DIAGNOSIS — Z79899 Other long term (current) drug therapy: Secondary | ICD-10-CM | POA: Diagnosis not present

## 2021-12-26 DIAGNOSIS — R739 Hyperglycemia, unspecified: Secondary | ICD-10-CM | POA: Diagnosis present

## 2021-12-26 DIAGNOSIS — F1990 Other psychoactive substance use, unspecified, uncomplicated: Secondary | ICD-10-CM | POA: Diagnosis present

## 2021-12-26 DIAGNOSIS — Z91410 Personal history of adult physical and sexual abuse: Secondary | ICD-10-CM | POA: Diagnosis not present

## 2021-12-26 DIAGNOSIS — R112 Nausea with vomiting, unspecified: Secondary | ICD-10-CM | POA: Diagnosis present

## 2021-12-26 DIAGNOSIS — Z046 Encounter for general psychiatric examination, requested by authority: Secondary | ICD-10-CM

## 2021-12-26 DIAGNOSIS — Z20822 Contact with and (suspected) exposure to covid-19: Secondary | ICD-10-CM | POA: Diagnosis present

## 2021-12-26 DIAGNOSIS — R0789 Other chest pain: Secondary | ICD-10-CM

## 2021-12-26 DIAGNOSIS — F329 Major depressive disorder, single episode, unspecified: Secondary | ICD-10-CM | POA: Diagnosis present

## 2021-12-26 DIAGNOSIS — E860 Dehydration: Secondary | ICD-10-CM | POA: Diagnosis present

## 2021-12-26 DIAGNOSIS — F17210 Nicotine dependence, cigarettes, uncomplicated: Secondary | ICD-10-CM | POA: Diagnosis present

## 2021-12-26 DIAGNOSIS — F1920 Other psychoactive substance dependence, uncomplicated: Secondary | ICD-10-CM

## 2021-12-26 DIAGNOSIS — F1210 Cannabis abuse, uncomplicated: Secondary | ICD-10-CM | POA: Diagnosis not present

## 2021-12-26 LAB — COMPREHENSIVE METABOLIC PANEL
ALT: 16 U/L (ref 0–44)
ALT: 18 U/L (ref 0–44)
AST: 18 U/L (ref 15–41)
AST: 16 U/L (ref 15–41)
Albumin: 4.4 g/dL (ref 3.5–5.0)
Albumin: 4.8 g/dL (ref 3.5–5.0)
Alkaline Phosphatase: 53 U/L (ref 38–126)
Alkaline Phosphatase: 55 U/L (ref 38–126)
Anion gap: 9 (ref 5–15)
Anion gap: 8 (ref 5–15)
BUN: 9 mg/dL (ref 6–20)
BUN: 8 mg/dL (ref 6–20)
CO2: 27 mmol/L (ref 22–32)
CO2: 25 mmol/L (ref 22–32)
Calcium: 9.4 mg/dL (ref 8.9–10.3)
Calcium: 9.3 mg/dL (ref 8.9–10.3)
Chloride: 109 mmol/L (ref 98–111)
Chloride: 107 mmol/L (ref 98–111)
Creatinine, Ser: 0.84 mg/dL (ref 0.61–1.24)
Creatinine, Ser: 0.92 mg/dL (ref 0.61–1.24)
GFR, Estimated: >60 mL/min (ref >60)
GFR, Estimated: >60 mL/min (ref >60)
Glucose, Bld: 82 mg/dL (ref 70–99)
Glucose, Bld: 110 mg/dL — ABNORMAL HIGH (ref 70–99)
Potassium: 3.6 mmol/L (ref 3.5–5.1)
Potassium: 3.9 mmol/L (ref 3.5–5.1)
Sodium: 145 mmol/L (ref 135–145)
Sodium: 140 mmol/L (ref 135–145)
Total Bilirubin: 1.3 mg/dL — ABNORMAL HIGH (ref 0.3–1.2)
Total Bilirubin: 1.3 mg/dL — ABNORMAL HIGH (ref 0.3–1.2)
Total Protein: 6.7 g/dL (ref 6.5–8.1)
Total Protein: 7.1 g/dL (ref 6.5–8.1)

## 2021-12-26 LAB — CBC
HCT: 45.3 % (ref 39.0–52.0)
HCT: 45.9 % (ref 39.0–52.0)
Hemoglobin: 15.3 g/dL (ref 13.0–17.0)
Hemoglobin: 15.7 g/dL (ref 13.0–17.0)
MCH: 30.7 pg (ref 26.0–34.0)
MCH: 30.8 pg (ref 26.0–34.0)
MCHC: 33.8 g/dL (ref 30.0–36.0)
MCHC: 34.2 g/dL (ref 30.0–36.0)
MCV: 90.8 fL (ref 80.0–100.0)
MCV: 90.2 fL (ref 80.0–100.0)
Platelets: 269 10*3/uL (ref 150–400)
Platelets: 265 10*3/uL (ref 150–400)
RBC: 4.99 MIL/uL (ref 4.22–5.81)
RBC: 5.09 MIL/uL (ref 4.22–5.81)
RDW: 13 % (ref 11.5–15.5)
RDW: 12.9 % (ref 11.5–15.5)
WBC: 9.4 10*3/uL (ref 4.0–10.5)
WBC: 9.5 10*3/uL (ref 4.0–10.5)
nRBC: 0 % (ref 0.0–0.2)
nRBC: 0 % (ref 0.0–0.2)

## 2021-12-26 LAB — LIPASE, BLOOD: Lipase: 24 U/L (ref 11–51)

## 2021-12-26 LAB — LACTIC ACID, PLASMA: Lactic Acid, Venous: 1 mmol/L (ref 0.5–1.9)

## 2021-12-26 MED ORDER — PROCHLORPERAZINE EDISYLATE 10 MG/2ML IJ SOLN
10.0000 mg | Freq: Four times a day (QID) | INTRAMUSCULAR | Status: DC | PRN
Start: 2021-12-26 — End: 2021-12-27
  Administered 2021-12-26: 10 mg via INTRAVENOUS
  Filled 2021-12-26: qty 2

## 2021-12-26 MED ORDER — ALUM & MAG HYDROXIDE-SIMETH 200-200-20 MG/5ML PO SUSP
30.0000 mL | Freq: Three times a day (TID) | ORAL | Status: DC | PRN
  Administered 2021-12-26: 30 mL via ORAL
  Filled 2021-12-26: qty 30

## 2021-12-26 MED ORDER — PANTOPRAZOLE SODIUM 40 MG IV SOLR
40.0000 mg | Freq: Two times a day (BID) | INTRAVENOUS | Status: DC
  Administered 2021-12-26 – 2021-12-27 (×2): 40 mg via INTRAVENOUS
  Filled 2021-12-26 (×3): qty 10

## 2021-12-26 MED ORDER — PANTOPRAZOLE SODIUM 40 MG PO TBEC
40.0000 mg | DELAYED_RELEASE_TABLET | Freq: Every day | ORAL | Status: DC
  Administered 2021-12-26: 40 mg via ORAL
  Filled 2021-12-26: qty 1

## 2021-12-26 MED ORDER — TRAZODONE HCL 50 MG PO TABS
25.0000 mg | ORAL_TABLET | Freq: Once | ORAL | Status: DC
  Administered 2021-12-26: 25 mg via ORAL
  Filled 2021-12-26: qty 1

## 2021-12-26 MED ORDER — FENTANYL CITRATE PF 50 MCG/ML IJ SOSY
25.0000 ug | PREFILLED_SYRINGE | Freq: Once | INTRAMUSCULAR | Status: AC
  Administered 2021-12-26: 25 ug via INTRAVENOUS
  Filled 2021-12-26: qty 1

## 2021-12-26 MED ORDER — ACETAMINOPHEN 325 MG PO TABS
650.0000 mg | ORAL_TABLET | Freq: Four times a day (QID) | ORAL | Status: DC | PRN
  Filled 2021-12-26: qty 2

## 2021-12-26 MED ORDER — POTASSIUM CHLORIDE 2 MEQ/ML IV SOLN
INTRAVENOUS | Status: DC
  Filled 2021-12-26 (×4): qty 1000

## 2021-12-26 MED ORDER — PROCHLORPERAZINE EDISYLATE 10 MG/2ML IJ SOLN: 5.0000 mg | Freq: Four times a day (QID) | INTRAMUSCULAR | Status: DC | PRN

## 2021-12-26 MED ORDER — KETOROLAC TROMETHAMINE 15 MG/ML IJ SOLN
15.0000 mg | Freq: Three times a day (TID) | INTRAMUSCULAR | Status: DC | PRN
  Administered 2021-12-26: 15 mg via INTRAVENOUS
  Filled 2021-12-26: qty 1
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
## 2021-12-26 NOTE — Progress Notes (Signed)
Extensive conversation with patients mother, Angelica Chessman, about disposition. She expressed concern of relapse in drug use if discharged home. She expressed possible rehab disposition. I told her I would relay to MD and SW to speak with patient about this, as he would need to be agreeable to it. Told her I was unsure how to get IVC order discontinued and until then he would be here. Explained to her that he was discharged from inpatient [facility] and now is a medical admission here at [facility] with IVC orders. Patient is very adamant about wanting to leave, explained to him what IVC meant and that I am unable to let him leave. Relayed to nightshift RN to contact MD and SW in morning, and have them contact mother if okay with patient.

## 2021-12-26 NOTE — Plan of Care (Signed)
  Problem: Activity: Goal: Risk for activity intolerance will decrease Outcome: Progressing   Problem: Coping: Goal: Level of anxiety will decrease Outcome: Progressing   

## 2021-12-27 DIAGNOSIS — R112 Nausea with vomiting, unspecified: Secondary | ICD-10-CM | POA: Diagnosis not present

## 2021-12-27 DIAGNOSIS — Z046 Encounter for general psychiatric examination, requested by authority: Secondary | ICD-10-CM

## 2021-12-27 DIAGNOSIS — K30 Functional dyspepsia: Secondary | ICD-10-CM | POA: Diagnosis not present

## 2021-12-27 DIAGNOSIS — F1990 Other psychoactive substance use, unspecified, uncomplicated: Secondary | ICD-10-CM

## 2021-12-27 DIAGNOSIS — E872 Acidosis, unspecified: Secondary | ICD-10-CM

## 2021-12-27 DIAGNOSIS — F1210 Cannabis abuse, uncomplicated: Secondary | ICD-10-CM | POA: Diagnosis not present

## 2021-12-27 MED ORDER — PANTOPRAZOLE SODIUM 40 MG PO TBEC: 40.0000 mg | DELAYED_RELEASE_TABLET | Freq: Every day | ORAL | 11 refills | Status: DC

## 2021-12-27 MED ORDER — ONDANSETRON HCL 4 MG PO TABS: 4.0000 mg | ORAL_TABLET | Freq: Four times a day (QID) | ORAL | 0 refills | Status: AC | PRN

## 2021-12-27 NOTE — Progress Notes (Signed)
Reviewed written d/c instructions w pt and all questions answered. Multiple resources listed in pt's d/c packet and pt enc to f/u w them. Unable to leave message w pt's mom (mailbox full) - pt cleared for d/c. D/C ambulatory w all belongings in stable condition.
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
## 2021-12-27 NOTE — TOC Transition Note (Signed)
Transition of Care [facility]) - CM/SW Discharge Note  Patient Details  Name: William [name] MRN: [id number] Date of Birth: [birth year]  Transition of Care [facility]) CM/SW Contact:  Ewing Schlein, LCSW Phone Number: 12/27/2021, 11:09 AM  Clinical Narrative: Patient has been psych cleared and will discharge home. CSW added additional substance use resources to AVS. CSW faxed change of commitment form to [place] office [phone]). CSW met with patient to notify him of additional substance use resources and that the IVC has been rescinded. TOC signing off.  Final next level of care: Home/Self Care Barriers to Discharge: Barriers Resolved  Patient Goals and CMS Choice Patient states their goals for this hospitalization and ongoing recovery are:: Return home Choice offered to / list presented to : NA  Discharge Plan and Services       DME Arranged: N/A DME Agency: NA  Readmission Risk Interventions     No data to display
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
## 2022-02-09 ENCOUNTER — Ambulatory Visit (HOSPITAL_COMMUNITY)
Admission: EM | Admit: 2022-02-09 | Discharge: 2022-02-09 | Discharge status: Against Medical Advice | Payer: BC Managed Care – PPO

## 2022-02-09 ENCOUNTER — Emergency Department (EMERGENCY_DEPARTMENT_HOSPITAL)
Admission: EM | Admit: 2022-02-09 | Discharge: 2022-02-10 | Disposition: A | Discharge status: Other Acute Inpatient Hospital | Payer: BC Managed Care – PPO | Source: Home / Self Care | Attending: Emergency Medicine | Admitting: Emergency Medicine

## 2022-02-09 DIAGNOSIS — Y900 Blood alcohol level of less than 20 mg/100 ml: Secondary | ICD-10-CM | POA: Insufficient documentation

## 2022-02-09 DIAGNOSIS — R000 Tachycardia, unspecified: Secondary | ICD-10-CM | POA: Insufficient documentation

## 2022-02-09 DIAGNOSIS — R451 Restlessness and agitation: Secondary | ICD-10-CM | POA: Insufficient documentation

## 2022-02-09 DIAGNOSIS — D72829 Elevated white blood cell count, unspecified: Secondary | ICD-10-CM | POA: Insufficient documentation

## 2022-02-09 DIAGNOSIS — F332 Major depressive disorder, recurrent severe without psychotic features: Secondary | ICD-10-CM | POA: Diagnosis not present

## 2022-02-09 DIAGNOSIS — F1994 Other psychoactive substance use, unspecified with psychoactive substance-induced mood disorder: Secondary | ICD-10-CM

## 2022-02-09 DIAGNOSIS — Z20822 Contact with and (suspected) exposure to covid-19: Secondary | ICD-10-CM | POA: Insufficient documentation

## 2022-02-09 DIAGNOSIS — F1914 Other psychoactive substance abuse with psychoactive substance-induced mood disorder: Secondary | ICD-10-CM | POA: Insufficient documentation

## 2022-02-09 DIAGNOSIS — Z79899 Other long term (current) drug therapy: Secondary | ICD-10-CM | POA: Insufficient documentation

## 2022-02-09 DIAGNOSIS — R61 Generalized hyperhidrosis: Secondary | ICD-10-CM | POA: Insufficient documentation

## 2022-02-09 LAB — COMPREHENSIVE METABOLIC PANEL
ALT: 20 U/L (ref 0–44)
AST: 30 U/L (ref 15–41)
Albumin: 4.8 g/dL (ref 3.5–5.0)
Alkaline Phosphatase: 54 U/L (ref 38–126)
Anion gap: 12 (ref 5–15)
BUN: 8 mg/dL (ref 6–20)
CO2: 21 mmol/L — ABNORMAL LOW (ref 22–32)
Calcium: 10 mg/dL (ref 8.9–10.3)
Chloride: 106 mmol/L (ref 98–111)
Creatinine, Ser: 1.19 mg/dL (ref 0.61–1.24)
GFR, Estimated: >60 mL/min (ref >60)
Glucose, Bld: 113 mg/dL — ABNORMAL HIGH (ref 70–99)
Potassium: 3 mmol/L — ABNORMAL LOW (ref 3.5–5.1)
Sodium: 139 mmol/L (ref 135–145)
Total Bilirubin: 1.2 mg/dL (ref 0.3–1.2)
Total Protein: 7 g/dL (ref 6.5–8.1)

## 2022-02-09 LAB — RESP PANEL BY RT-PCR (FLU A&B, COVID) ARPGX2
Influenza A by PCR: NEGATIVE
Influenza B by PCR: NEGATIVE
SARS Coronavirus 2 by RT PCR: NEGATIVE

## 2022-02-09 LAB — CBC WITH DIFFERENTIAL/PLATELET
Abs Immature Granulocytes: 0.15 10*3/uL — ABNORMAL HIGH (ref 0.00–0.07)
Basophils Absolute: 0 10*3/uL (ref 0.0–0.1)
Basophils Relative: 0 %
Eosinophils Absolute: 0 10*3/uL (ref 0.0–0.5)
Eosinophils Relative: 0 %
HCT: 45.6 % (ref 39.0–52.0)
Hemoglobin: 16 g/dL (ref 13.0–17.0)
Immature Granulocytes: 1 %
Lymphocytes Relative: 9 %
Lymphs Abs: 1.4 10*3/uL (ref 0.7–4.0)
MCH: 30.8 pg (ref 26.0–34.0)
MCHC: 35.1 g/dL (ref 30.0–36.0)
MCV: 87.9 fL (ref 80.0–100.0)
Monocytes Absolute: 1.2 10*3/uL — ABNORMAL HIGH (ref 0.1–1.0)
Monocytes Relative: 7 %
Neutro Abs: 13.4 10*3/uL — ABNORMAL HIGH (ref 1.7–7.7)
Neutrophils Relative %: 83 %
Platelets: 262 10*3/uL (ref 150–400)
RBC: 5.19 MIL/uL (ref 4.22–5.81)
RDW: 13 % (ref 11.5–15.5)
WBC: 16.2 10*3/uL — ABNORMAL HIGH (ref 4.0–10.5)
nRBC: 0 % (ref 0.0–0.2)

## 2022-02-09 LAB — RAPID URINE DRUG SCREEN, HOSP PERFORMED
Amphetamines: POSITIVE — AB
Barbiturates: POSITIVE — AB
Benzodiazepines: NOT DETECTED
Cocaine: NOT DETECTED
Opiates: NOT DETECTED
Tetrahydrocannabinol: POSITIVE — AB

## 2022-02-09 LAB — ACETAMINOPHEN LEVEL: Acetaminophen (Tylenol), Serum: <10 ug/mL — ABNORMAL LOW (ref 10–30)

## 2022-02-09 LAB — SALICYLATE LEVEL: Salicylate Lvl: <7 mg/dL — ABNORMAL LOW (ref 7.0–30.0)

## 2022-02-09 LAB — CBG MONITORING, ED: Glucose-Capillary: 129 mg/dL — ABNORMAL HIGH (ref 70–99)

## 2022-02-09 LAB — ETHANOL: Alcohol, Ethyl (B): <10 mg/dL (ref <10)

## 2022-02-09 MED ORDER — POTASSIUM CHLORIDE CRYS ER 20 MEQ PO TBCR
40.0000 meq | EXTENDED_RELEASE_TABLET | Freq: Once | ORAL | Status: AC
  Administered 2022-02-09: 40 meq via ORAL
  Filled 2022-02-09: qty 2

## 2022-02-09 MED ORDER — QUETIAPINE FUMARATE 50 MG PO TABS
50.0000 mg | ORAL_TABLET | Freq: Every day | ORAL | Status: DC
  Administered 2022-02-09 – 2022-02-10 (×2): 50 mg via ORAL
  Filled 2022-02-09 (×2): qty 1

## 2022-02-09 MED ORDER — SODIUM CHLORIDE 0.9 % IV BOLUS
1000.0000 mL | Freq: Once | INTRAVENOUS | Status: AC
  Administered 2022-02-09: 1000 mL via INTRAVENOUS

## 2022-02-09 MED ORDER — QUETIAPINE FUMARATE 100 MG PO TABS: 100.0000 mg | ORAL_TABLET | Freq: Every day | ORAL | Status: DC

## 2022-02-09 MED ORDER — RISPERIDONE 0.5 MG PO TBDP: 2.0000 mg | ORAL_TABLET | Freq: Three times a day (TID) | ORAL | Status: DC | PRN

## 2022-02-09 MED ORDER — ZIPRASIDONE MESYLATE 20 MG IM SOLR: 20.0000 mg | INTRAMUSCULAR | Status: DC | PRN

## 2022-02-09 MED ORDER — ACETAMINOPHEN 325 MG PO TABS: 650.0000 mg | ORAL_TABLET | ORAL | Status: DC | PRN

## 2022-02-09 MED ORDER — HALOPERIDOL LACTATE 5 MG/ML IJ SOLN
5.0000 mg | Freq: Once | INTRAMUSCULAR | Status: AC
  Administered 2022-02-09: 5 mg via INTRAMUSCULAR
  Filled 2022-02-09: qty 1

## 2022-02-09 MED ORDER — LORAZEPAM 2 MG/ML IJ SOLN
2.0000 mg | Freq: Once | INTRAMUSCULAR | Status: AC
  Administered 2022-02-09: 2 mg via INTRAMUSCULAR
  Filled 2022-02-09: qty 1

## 2022-02-09 MED ORDER — LORAZEPAM 1 MG PO TABS: 1.0000 mg | ORAL_TABLET | ORAL | Status: DC | PRN

## 2022-02-09 MED ORDER — PROPRANOLOL HCL 20 MG PO TABS
10.0000 mg | ORAL_TABLET | Freq: Two times a day (BID) | ORAL | Status: DC
  Administered 2022-02-09 – 2022-02-10 (×2): 10 mg via ORAL
  Filled 2022-02-09 (×2): qty 1

## 2022-02-09 NOTE — ED Notes (Signed)
Patient is much calmer than before and keeps asking to be unrestraint. Asking when he is able to go as well.

## 2022-02-09 NOTE — Progress Notes (Signed)
CSW requested that [facility] review pt. CSW will assist and follow.  Maryjean Ka, MSW, LCSWA 02/09/2022 11:06 PM

## 2022-02-09 NOTE — ED Notes (Signed)
Patient belongings stored in [facility] [employer]. Only has A cell phone and another round object.
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
## 2022-02-09 NOTE — ED Triage Notes (Signed)
Pt William [name] currently under the influence of meth. Pt reports last use a few hours ago. Pt was brought in by [facility] for substance use treatment.Pt is paranoid and agitated. Pt denies SI/HI and AVH. Pt became upset and requested to leave without being seen by a provider. Pt signed MSE waiver

## 2022-02-09 NOTE — BH Assessment (Signed)
Per Fransico Michael, [facility], Pt has been accepted pending discharges on 02/10/2022.   Pamalee Leyden, [facility], [facility] Triage Specialist [phone]

## 2022-02-09 NOTE — ED Notes (Signed)
Previous shift discontinued restraints. Pt no longer in restraints, pt is calm and cooperative.
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
## 2022-02-09 NOTE — ED Notes (Signed)
Pt is changed into purple scrubs and belongings are located in triage.

## 2022-02-09 NOTE — ED Notes (Signed)
Pt became upset and requested to leave without being seen by a provider. Pt signed MSE waiver.

## 2022-02-09 NOTE — ED Triage Notes (Signed)
Pt unable to be triaged due to him running outside. Pt uncooperative with [employer] who has IVC paperwork already filed on him. MD notified and meds ordered, but unable to give until PD brings pt back inside.

## 2022-02-10 ENCOUNTER — Encounter (HOSPITAL_COMMUNITY): Payer: Self-pay | Admitting: Psychiatry

## 2022-02-10 ENCOUNTER — Other Ambulatory Visit: Payer: Self-pay

## 2022-02-10 ENCOUNTER — Other Ambulatory Visit: Payer: Self-pay | Admitting: Psychiatry

## 2022-02-10 ENCOUNTER — Inpatient Hospital Stay (HOSPITAL_COMMUNITY)
Admission: AD | Admit: 2022-02-10 | Discharge: 2022-02-15 | DRG: 885 | Payer: BC Managed Care – PPO | Source: Intra-hospital | Attending: Psychiatry | Admitting: Psychiatry

## 2022-02-10 DIAGNOSIS — Z20822 Contact with and (suspected) exposure to covid-19: Secondary | ICD-10-CM | POA: Diagnosis present

## 2022-02-10 DIAGNOSIS — F4310 Post-traumatic stress disorder, unspecified: Secondary | ICD-10-CM | POA: Diagnosis present

## 2022-02-10 DIAGNOSIS — F429 Obsessive-compulsive disorder, unspecified: Secondary | ICD-10-CM | POA: Diagnosis present

## 2022-02-10 DIAGNOSIS — F1994 Other psychoactive substance use, unspecified with psychoactive substance-induced mood disorder: Secondary | ICD-10-CM | POA: Diagnosis present

## 2022-02-10 DIAGNOSIS — I10 Essential (primary) hypertension: Secondary | ICD-10-CM | POA: Diagnosis present

## 2022-02-10 DIAGNOSIS — F1510 Other stimulant abuse, uncomplicated: Secondary | ICD-10-CM | POA: Diagnosis present

## 2022-02-10 DIAGNOSIS — F332 Major depressive disorder, recurrent severe without psychotic features: Secondary | ICD-10-CM | POA: Diagnosis present

## 2022-02-10 DIAGNOSIS — F1999 Other psychoactive substance use, unspecified with unspecified psychoactive substance-induced disorder: Secondary | ICD-10-CM | POA: Diagnosis present

## 2022-02-10 DIAGNOSIS — Z79899 Other long term (current) drug therapy: Secondary | ICD-10-CM

## 2022-02-10 DIAGNOSIS — F17210 Nicotine dependence, cigarettes, uncomplicated: Secondary | ICD-10-CM | POA: Diagnosis present

## 2022-02-10 DIAGNOSIS — F1520 Other stimulant dependence, uncomplicated: Secondary | ICD-10-CM | POA: Diagnosis present

## 2022-02-10 DIAGNOSIS — F1210 Cannabis abuse, uncomplicated: Secondary | ICD-10-CM | POA: Diagnosis present

## 2022-02-10 MED ORDER — RISPERIDONE 2 MG PO TBDP: 2.0000 mg | ORAL_TABLET | Freq: Three times a day (TID) | ORAL | Status: DC | PRN

## 2022-02-10 MED ORDER — MAGNESIUM HYDROXIDE 400 MG/5ML PO SUSP: 30.0000 mL | Freq: Every day | ORAL | Status: DC | PRN

## 2022-02-10 MED ORDER — QUETIAPINE FUMARATE 50 MG PO TABS
50.0000 mg | ORAL_TABLET | Freq: Every day | ORAL | Status: DC
  Administered 2022-02-11: 50 mg via ORAL
  Filled 2022-02-10 (×3): qty 1

## 2022-02-10 MED ORDER — NICOTINE 21 MG/24HR TD PT24
21.0000 mg | MEDICATED_PATCH | Freq: Every day | TRANSDERMAL | Status: DC
  Administered 2022-02-10: 21 mg via TRANSDERMAL
  Filled 2022-02-10: qty 1

## 2022-02-10 MED ORDER — NICOTINE 21 MG/24HR TD PT24
21.0000 mg | MEDICATED_PATCH | Freq: Every day | TRANSDERMAL | Status: DC
  Filled 2022-02-10: qty 1

## 2022-02-10 MED ORDER — PROPRANOLOL HCL 10 MG PO TABS
10.0000 mg | ORAL_TABLET | Freq: Two times a day (BID) | ORAL | Status: DC
  Administered 2022-02-10 – 2022-02-15 (×10): 10 mg via ORAL
  Filled 2022-02-10 (×15): qty 1

## 2022-02-10 MED ORDER — NICOTINE POLACRILEX 2 MG MT GUM
2.0000 mg | CHEWING_GUM | OROMUCOSAL | Status: DC | PRN
Start: 2022-02-10 — End: 2022-02-15
  Administered 2022-02-10 – 2022-02-15 (×14): 2 mg via ORAL
  Filled 2022-02-10 (×9): qty 1

## 2022-02-10 MED ORDER — LORAZEPAM 1 MG PO TABS: 1.0000 mg | ORAL_TABLET | ORAL | Status: DC | PRN

## 2022-02-10 MED ORDER — ALUM & MAG HYDROXIDE-SIMETH 200-200-20 MG/5ML PO SUSP: 30.0000 mL | ORAL | Status: DC | PRN

## 2022-02-10 MED ORDER — ACETAMINOPHEN 325 MG PO TABS: 650.0000 mg | ORAL_TABLET | Freq: Four times a day (QID) | ORAL | Status: DC | PRN

## 2022-02-10 MED ORDER — ZIPRASIDONE MESYLATE 20 MG IM SOLR: 20.0000 mg | INTRAMUSCULAR | Status: DC | PRN

## 2022-02-10 MED ORDER — QUETIAPINE FUMARATE 100 MG PO TABS
100.0000 mg | ORAL_TABLET | Freq: Every day | ORAL | Status: DC
  Administered 2022-02-10: 100 mg via ORAL
  Filled 2022-02-10 (×4): qty 1
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
## 2022-02-10 NOTE — ED Notes (Signed)
Offered pt a breakfast tray, refused everything on the tray but the banana and cranberry juice.

## 2022-02-10 NOTE — ED Notes (Signed)
Patient slept majority of shift after coming back to TCU. He is awake now and is calm, pleasant, and cooperative.

## 2022-02-10 NOTE — Progress Notes (Signed)
Pt will be accepted to [facility] 02/10/22 PENDING discharges per [facility] Fransico Michael, RN. [facility] will coordinate.   Maryjean Ka, MSW, LCSWA 02/10/2022 1:05 AM

## 2022-02-10 NOTE — ED Notes (Signed)
Pt requested nicotine patch

## 2022-02-10 NOTE — ED Notes (Signed)
Attempted to call [facility] for report, no answer, will call again.

## 2022-02-10 NOTE — Progress Notes (Signed)
   02/10/22 2000  Psych Admission Type (Psych Patients Only)  Admission Status Voluntary  Psychosocial Assessment  Patient Complaints Anxiety  Eye Contact Fair  Facial Expression Anxious  Affect Anxious  Speech Rapid  Interaction Assertive  Motor Activity Pacing  Appearance/Hygiene In scrubs  Behavior Characteristics Cooperative  Mood Anxious  Aggressive Behavior  Effect No apparent injury  Thought Process  Coherency Circumstantial  Content Preoccupation  Delusions Paranoid  Perception WDL  Hallucination None reported or observed  Judgment Poor  Confusion None  Danger to Self  Current suicidal ideation? Passive  Danger to Others  Danger to Others None reported or observed

## 2022-02-10 NOTE — Plan of Care (Signed)
  Problem: Education: Goal: Knowledge of [facility] Education information/materials will improve Outcome: Progressing Goal: Emotional status will improve Outcome: Progressing Goal: Mental status will improve Outcome: Progressing Goal: Verbalization of understanding the information provided will improve Outcome: Progressing   Problem: Activity: Goal: Interest or engagement in activities will improve Outcome: Progressing Goal: Sleeping patterns will improve Outcome: Progressing   Problem: Coping: Goal: Ability to verbalize frustrations and anger appropriately will improve Outcome: Progressing Goal: Ability to demonstrate self-control will improve Outcome: Progressing   Problem: Health Behavior/Discharge Planning: Goal: Identification of resources available to assist in meeting health care needs will improve Outcome: Progressing Goal: Compliance with treatment plan for underlying cause of condition will improve Outcome: Progressing   Problem: Physical Regulation: Goal: Ability to maintain clinical measurements within normal limits will improve Outcome: Progressing   Problem: Safety: Goal: Periods of time without injury will increase Outcome: Progressing   
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
## 2022-02-10 NOTE — Group Note (Signed)
[facility] LCSW Group Therapy Note   Group Date: 02/10/2022 Start Time: 1300 End Time: 1400   Type of Therapy and Topic: Group Therapy: Avoiding Self-Sabotaging and Enabling Behaviors  Participation Level: Did Not Attend  Mood:  Description of Group:  In this group, patients will learn how to identify obstacles, self-sabotaging and enabling behaviors, as well as: what are they, why do we do them and what needs these behaviors meet. Discuss unhealthy relationships and how to have positive healthy boundaries with those that sabotage and enable. Explore aspects of self-sabotage and enabling in yourself and how to limit these self-destructive behaviors in everyday life.   Therapeutic Goals: 1. Patient will identify one obstacle that relates to self-sabotage and enabling behaviors 2. Patient will identify one personal self-sabotaging or enabling behavior they did prior to admission 3. Patient will state a plan to change the above identified behavior 4. Patient will demonstrate ability to communicate their needs through discussion and/or role play.    Summary of Patient Progress:   Patient did not attend group despite encouraged participation.    Therapeutic Modalities:  Cognitive Behavioral Therapy Person-Centered Therapy Motivational Interviewing    Pollyann Roa W Mabelle Mungin, LCSWA 

## 2022-02-11 MED ORDER — POTASSIUM CHLORIDE CRYS ER 20 MEQ PO TBCR
40.0000 meq | EXTENDED_RELEASE_TABLET | Freq: Two times a day (BID) | ORAL | Status: DC
  Administered 2022-02-11 – 2022-02-13 (×5): 40 meq via ORAL
  Filled 2022-02-11 (×9): qty 2

## 2022-02-11 MED ORDER — QUETIAPINE FUMARATE ER 50 MG PO TB24
50.0000 mg | ORAL_TABLET | Freq: Every day | ORAL | Status: DC
  Administered 2022-02-11 – 2022-02-12 (×2): 50 mg via ORAL
  Filled 2022-02-11 (×6): qty 1

## 2022-02-11 MED ORDER — FLUOXETINE HCL 10 MG PO CAPS
10.0000 mg | ORAL_CAPSULE | Freq: Every day | ORAL | Status: DC
  Administered 2022-02-11 – 2022-02-13 (×3): 10 mg via ORAL
  Filled 2022-02-11 (×6): qty 1

## 2022-02-11 MED ORDER — QUETIAPINE FUMARATE 100 MG PO TABS
100.0000 mg | ORAL_TABLET | Freq: Every day | ORAL | Status: DC
  Administered 2022-02-11 – 2022-02-12 (×2): 100 mg via ORAL
  Filled 2022-02-11 (×5): qty 1

## 2022-02-11 NOTE — Group Note (Signed)
Date:  02/11/2022 Time:  9:27 AM  Group Topic/Focus:  Goals Group:   The focus of this group is to help patients establish daily goals to achieve during treatment and discuss how the patient can incorporate goal setting into their daily lives to aide in recovery. Orientation:   The focus of this group is to educate the patient on the purpose and policies of crisis stabilization and provide a format to answer questions about their admission.  The group details unit policies and expectations of patients while admitted.    Participation Level:  Did Not Attend  Participation Quality:    Affect:    Cognitive:    Insight:   Engagement in Group:    Modes of Intervention:    Additional Comments:   [name] 02/11/2022, 9:27 AM

## 2022-02-11 NOTE — Progress Notes (Signed)
D. Pt remained in bed for much of the morning with complaints of being tired. Pt did attend afternoon group led by RN after some prompting, and reported that he "enjoyed it and got a lot out of it." Pt currently denies SI/HI and AVH and does not appear to be responding to internal stimuli. A. Labs and vitals monitored. Pt given and educated on medications. Pt supported emotionally and encouraged to express concerns and ask questions.   R. Pt remains safe with 15 minute checks. Will continue POC.

## 2022-02-11 NOTE — [facility] Group Notes (Signed)
Psychoeducational Group Note    Date:  02/11/2022 Time: 1300-1400    Purpose of Group: . The group focus' on teaching patients on how to identify their needs and their Life Skills:  A group where two lists are made. What people need and what are things that we do that are unhealthy. The lists are developed by the patients and it is explained that we often do the actions that are not healthy to get our list of needs met.  Goal:: to develop the coping skills needed to get their needs met  Participation Level:  Active  Participation Quality:  Appropriate  Affect:  Appropriate  Cognitive:  Oriented  Insight: Improving  Engagement in Group:  Engaged  Modes of Intervention:  Activity, Discussion, Education, and Support  Additional Comments:  Rates energy at a 9/10. Partisipated fully in the group  Bryson Dames A
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
## 2022-02-11 NOTE — Group Note (Signed)
LCSW Group Therapy Note No social work group was held today due to newly separated halls necessitating a higher number of groups, a significant number of expected and unexpected discharges, a number of necessary Suicide Prevention Education calls to family members, and a high number of admissions that required initial psychosocial assessments.  The following was provided to each patient on 300 and 500 halls in lieu of in-person group:  Healthy vs. Unhealthy Coping Skills and Supports   Unhealthy Qualities                                             Healthy Qualities Works (at first) Works   Stops working or starts hurting Continues working  Fast Usually takes time to develop  Easy Often difficult to learn  Usually a habit Usually unknown, has to become a habit  Can do alone Often need to reach out for help   Leads to loss Leads to gain         My Unhealthy Coping Skills                                    My Healthy Coping Skills                       My Unhealthy Supports                                           My Healthy Supports                         William Mau Grossman-Orr, LCSW 02/11/2022  2:43 PM     

## 2022-02-11 NOTE — H&P (Addendum)
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 -Maalox 30 mL oral every 4 as needed/digestion -Magnesium hydroxide 30 mL daily as needed/mild constipation --Atarax 25mg  TID, anxiety --Trazodone 50mg  QHS, insomnia    Discharge Planning: Social work and case management to assist with discharge planning and identification of hospital follow-up needs prior to discharge Estimated LOS: 5-7  days Discharge Concerns: Need to establish a safety plan; Medication compliance and effectiveness Discharge Goals: Return home with outpatient referrals for mental health follow-up including medication management/psychotherapy.  I certify that inpatient services furnished can reasonably be expected to improve the patient's condition.    , FNP 8/12/202311:29 AM

## 2022-02-11 NOTE — [facility] Group Notes (Signed)
Goals Group 78/2023   Group Focus: affirmation, clarity of thought, and goals/reality orientation Treatment Modality:  Psychoeducation Interventions utilized were assignment, group exercise, and support Purpose: To be able to understand and verbalize the reason for their admission to the hospital. To understand that the medication helps with their chemical imbalance but they also need to work on their choices in life. To be challenged to develop a list of 30 positives about themselves. Also introduce the concept that "feelings" are not reality.  Participation Level:  did not attend  Tanor Glaspy A 
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
## 2022-02-12 DIAGNOSIS — F1999 Other psychoactive substance use, unspecified with unspecified psychoactive substance-induced disorder: Secondary | ICD-10-CM

## 2022-02-12 LAB — BASIC METABOLIC PANEL
Anion gap: 8 (ref 5–15)
BUN: 8 mg/dL (ref 6–20)
CO2: 26 mmol/L (ref 22–32)
Calcium: 9.3 mg/dL (ref 8.9–10.3)
Chloride: 108 mmol/L (ref 98–111)
Creatinine, Ser: 1.09 mg/dL (ref 0.61–1.24)
GFR, Estimated: >60 mL/min (ref >60)
Glucose, Bld: 106 mg/dL — ABNORMAL HIGH (ref 70–99)
Potassium: 3.9 mmol/L (ref 3.5–5.1)
Sodium: 142 mmol/L (ref 135–145)

## 2022-02-12 LAB — CBC WITH DIFFERENTIAL/PLATELET
Abs Immature Granulocytes: 0.03 10*3/uL (ref 0.00–0.07)
Basophils Absolute: 0.1 10*3/uL (ref 0.0–0.1)
Basophils Relative: 1 %
Eosinophils Absolute: 0.1 10*3/uL (ref 0.0–0.5)
Eosinophils Relative: 1 %
HCT: 42.6 % (ref 39.0–52.0)
Hemoglobin: 14.4 g/dL (ref 13.0–17.0)
Immature Granulocytes: 1 %
Lymphocytes Relative: 37 %
Lymphs Abs: 1.8 10*3/uL (ref 0.7–4.0)
MCH: 31 pg (ref 26.0–34.0)
MCHC: 33.8 g/dL (ref 30.0–36.0)
MCV: 91.8 fL (ref 80.0–100.0)
Monocytes Absolute: 0.4 10*3/uL (ref 0.1–1.0)
Monocytes Relative: 8 %
Neutro Abs: 2.5 10*3/uL (ref 1.7–7.7)
Neutrophils Relative %: 52 %
Platelets: 229 10*3/uL (ref 150–400)
RBC: 4.64 MIL/uL (ref 4.22–5.81)
RDW: 13.2 % (ref 11.5–15.5)
WBC: 4.8 10*3/uL (ref 4.0–10.5)
nRBC: 0 % (ref 0.0–0.2)

## 2022-02-12 MED ORDER — QUETIAPINE FUMARATE 50 MG PO TABS
50.0000 mg | ORAL_TABLET | Freq: Every day | ORAL | Status: DC
  Administered 2022-02-13: 50 mg via ORAL
  Filled 2022-02-12 (×3): qty 1
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
## 2022-02-12 NOTE — [facility] Group Notes (Signed)
Adult Psychoeducational Group Note  Date:  02/12/2022 Time:  10:43 AM  Group Topic/Focus:  Goals Group:   The focus of this group is to help patients establish daily goals to achieve during treatment and discuss how the patient can incorporate goal setting into their daily lives to aide in recovery.  Participation Level:  Did Not Attend  Additional Comments:  Patient was told multiple times that group was starting and didn't attend.   Clerence Gubser T Lorraine Lax 02/12/2022, 10:43 AM

## 2022-02-12 NOTE — [facility] Group Notes (Signed)
Adult Psychoeducational Group  Date:  02/12/2022 Time:  1300-1400  Group Topic/Focus: Continuation of the group from Saturday. Looking at the lists that were created and talking about what needs to be done with the homework of 30 positives about themselves.                                     Talking about taking their power back and helping themselves to develop a positive self esteem.      Participation Quality:  Appropriate  Affect:  Appropriate  Cognitive:  Oriented  Insight: Improving  Engagement in Group:  Engaged  Modes of Intervention:  Activity, Discussion, Education, and Support  Additional Comments:  Rates energy at a 9/10.Marland Kitchen Participated fully in the group.shared and asked questions.  Dione Housekeeper

## 2022-02-12 NOTE — [facility] Group Notes (Signed)
Adult Psychoeducational Group Note Date:  02/12/2022 Time:  0900-1045 Group Topic/Focus: PROGRESSIVE RELAXATION. [name] group where deep breathing is taught and tensing and relaxation muscle groups is used. Imagery is used as well.  Pts are asked to imagine 3 pillars that hold them up when they are not able to hold themselves up and to share that with the group.  Participation Level:  did not attend  William [name]  

## 2022-02-12 NOTE — Progress Notes (Signed)
[facility] Group Notes:  (Nursing/MHT/Case Management/Adjunct)  Date:  02/12/2022  Time:  2000  Type of Therapy:   wrap up group  Participation Level:  Active  Participation Quality:  Appropriate, Attentive, Sharing, and Supportive  Affect:  Appropriate  Cognitive:  Alert  Insight:  Improving  Engagement in Group:  Engaged  Modes of Intervention:  Clarification, Education, and Support  Summary of Progress/Problems: Positive thinking and positive change were discussed.   Marcille Buffy 02/12/2022, 8:50 PM
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
## 2022-02-12 NOTE — Progress Notes (Signed)
   02/12/22 0500  Sleep  Number of Hours 6.75
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
## 2022-02-12 NOTE — Progress Notes (Signed)
[name] attended group tonight. He is compliant with his medications and interacting with peers and staff. William [name] denies S.I. and reports he went to hospital on his own to get help with his methamphetamine abuse. Focus is on discharge. He has no physical complaints.

## 2022-02-12 NOTE — Progress Notes (Signed)
Upon initial assessment pt was resting in bed, pt got up for snack, affect flat, mood depressed, rated his day a 10/10, then states "he doesn't suppose to be here." Disheveled, body odor noted, states he took a shower earlier. Reports no goal today, denies SI/HI or hallucinations. Received hs meds with no issues, given nicotine gum as requested (a) 15 min checks (r) safety maintained.

## 2022-02-12 NOTE — Group Note (Signed)
LCSW Group Therapy Note  02/12/2022      Type of Therapy and Topic:  Group Therapy: Gratitude  Participation Level:  Did Not Attend   Description of Group:   In this group, patients shared and discussed the importance of acknowledging the elements in their lives for which they are grateful and how this can positively impact their mood.  The group discussed how bringing the positive elements of their lives to the forefront of their minds can help with recovery from any illness, physical or mental.  An exercise was done as a group in which a list was made of gratitude items in order to encourage participants to consider other potential positives in their lives.  Therapeutic Goals: Patients will identify one or more item for which they are grateful in each of 6 categories:  people, experience, thing, place, skill, and other. Patients will discuss how it is possible to seek out gratitude in even bad situations. Patients will explore other possible items of gratitude that they could remember.   Summary of Patient Progress:  The patient was invited to group, did not attend.   Therapeutic Modalities:   Solution-Focused Therapy Activity  Carloyn Jaeger Grossman-Orr, LCSW .

## 2022-02-13 ENCOUNTER — Encounter (HOSPITAL_COMMUNITY): Payer: Self-pay

## 2022-02-13 DIAGNOSIS — F1520 Other stimulant dependence, uncomplicated: Secondary | ICD-10-CM

## 2022-02-13 DIAGNOSIS — F332 Major depressive disorder, recurrent severe without psychotic features: Principal | ICD-10-CM

## 2022-02-13 MED ORDER — QUETIAPINE FUMARATE 50 MG PO TABS
150.0000 mg | ORAL_TABLET | Freq: Every day | ORAL | Status: DC
  Administered 2022-02-13 – 2022-02-14 (×2): 150 mg via ORAL
  Filled 2022-02-13 (×4): qty 1

## 2022-02-13 NOTE — Progress Notes (Addendum)
ADDENDUM Patient has agreed to [facility] SUD residential program. Medical documents sent for review with patient's written consent.   Signed:  Corky Crafts, MSW, [place], LCAS 02/13/2022 2:47 PM  [facility] LCSW Progress Note   02/13/2022    2:26 PM  William [name]   [id number]   Type of Contact and Topic: SUD treatment referral  Patient expressed interest in attending SUD treatment, provided w/ options given that he has [employer] PPO. Patient encouraged to call facilities to complete admission screening interview. CSW to follow up at a later time in order to send supporting documentation   Situation ongoing, CSW will continue to monitor and update note as more information becomes available.     Signed:  Corky Crafts, MSW, LCSWA, LCAS 02/13/2022 2:26 PM

## 2022-02-13 NOTE — Group Note (Signed)
LCSW Group Therapy Note   Group Date: 02/13/2022 Start Time: 1300 End Time: 1400   Type of Therapy and Topic:  Group Therapy: Anger Cues and Responses  Participation Level:  Active   Description of Group:   In this group, patients learned how to recognize the physical, cognitive, emotional, and behavioral responses they have to anger-provoking situations.  They identified a recent time they became angry and how they reacted.  They analyzed how their reaction was possibly beneficial and how it was possibly unhelpful.  The group discussed a variety of healthier coping skills that could help with such a situation in the future.  Focus was placed on how helpful it is to recognize the underlying emotions to our anger, because working on those can lead to a more permanent solution as well as our ability to focus on the important rather than the urgent.  Therapeutic Goals: Patients will remember their last incident of anger and how they felt emotionally and physically, what their thoughts were at the time, and how they behaved. Patients will identify how their behavior at that time worked for them, as well as how it worked against them. Patients will explore possible new behaviors to use in future anger situations. Patients will learn that anger itself is normal and cannot be eliminated, and that healthier reactions can assist with resolving conflict rather than worsening situations.  Summary of Patient Progress:  Pt was present/active throughout the session and proved open to feedback from CSW and peers. Patient demonstrated good insight into the subject matter, was respectful of peers, and was present and engaged throughout the entire session.  Therapeutic Modalities:   Cognitive Behavioral Therapy  William [name] 02/13/2022  6:50 PM
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
## 2022-02-13 NOTE — [facility] Group Notes (Signed)
[facility] Group Notes:  (Nursing/MHT/Case Management/Adjunct)  Date:  02/13/2022  Time:  9:38 AM  Type of Therapy:  Group Therapy   Participation Level:  Did Not Attend   Summary of Progress/Problems:   Patient did not attend goals/ orientation group today. Patient was encouraged to go but refused.   William [name] 02/13/2022, 9:38 AM

## 2022-02-13 NOTE — BH IP Treatment Plan (Signed)
Interdisciplinary Treatment and Diagnostic Plan Update  02/13/2022 Time of Session: 0830 JAYLYNN SIEFERT MRN: [id number]  Principal Diagnosis: Substance-induced disorder [facility])  Secondary Diagnoses: Principal Problem:   Substance-induced disorder (HCC)   Current Medications:  Current Facility-Administered Medications  Medication Dose Route Frequency Provider Last Rate Last Admin   acetaminophen (TYLENOL) tablet 650 mg  650 mg Oral Q6H PRN Leevy-Johnson, Brooke A, NP       alum & mag hydroxide-simeth (MAALOX/MYLANTA) 200-200-20 MG/5ML suspension 30 mL  30 mL Oral Q4H PRN Leevy-Johnson, Brooke A, NP       FLUoxetine (PROZAC) capsule 10 mg  10 mg Oral Daily Ntuen, Jesusita Oka, FNP   10 mg at 02/13/22 1009   risperiDONE (RISPERDAL M-TABS) disintegrating tablet 2 mg  2 mg Oral Q8H PRN Leevy-Johnson, Brooke A, NP       And   LORazepam (ATIVAN) tablet 1 mg  1 mg Oral PRN Leevy-Johnson, Brooke A, NP       And   ziprasidone (GEODON) injection 20 mg  20 mg Intramuscular PRN Leevy-Johnson, Brooke A, NP       magnesium hydroxide (MILK OF MAGNESIA) suspension 30 mL  30 mL Oral Daily PRN Leevy-Johnson, Brooke A, NP       nicotine polacrilex (NICORETTE) gum 2 mg  2 mg Oral PRN Abbott Pao, Nadir, MD   2 mg at 02/13/22 1012   potassium chloride SA (KLOR-CON M) CR tablet 40 mEq  40 mEq Oral BID Ntuen, Jesusita Oka, FNP   40 mEq at 02/13/22 1009   propranolol (INDERAL) tablet 10 mg  10 mg Oral BID Leevy-Johnson, Brooke A, NP   10 mg at 02/13/22 1009   QUEtiapine (SEROQUEL) tablet 100 mg  100 mg Oral QHS Ntuen, Tina C, FNP   100 mg at 02/12/22 2132   QUEtiapine (SEROQUEL) tablet 50 mg  50 mg Oral Daily Attiah, Nadir, MD   50 mg at 02/13/22 1010   PTA Medications: Medications Prior to Admission  Medication Sig Dispense Refill Last Dose   FLUoxetine (PROZAC) 40 MG capsule Take 1 capsule (40 mg total) by mouth daily. (Patient not taking: Reported on 02/10/2022) 30 capsule 0    hydrOXYzine (ATARAX) 25 MG tablet Take 25-50 mg  by mouth as needed for anxiety.      ibuprofen (ADVIL) 200 MG tablet Take 200-400 mg by mouth as needed (pain).      pantoprazole (PROTONIX) 40 MG tablet Take 1 tablet (40 mg total) by mouth daily. (Patient not taking: Reported on 02/10/2022) 30 tablet 11     Patient Stressors: Health problems   Legal issue   Traumatic event    Patient Strengths: General fund of knowledge  Supportive family/friends   Treatment Modalities: Medication Management, Group therapy, Case management,  1 to 1 session with clinician, Psychoeducation, Recreational therapy.   Physician Treatment Plan for Primary Diagnosis: Substance-induced disorder [facility]) Long Term Goal(s): Improvement in symptoms so as ready for discharge   Short Term Goals: Ability to identify changes in lifestyle to reduce recurrence of condition will improve Ability to verbalize feelings will improve Ability to disclose and discuss suicidal ideas Ability to demonstrate self-control will improve Ability to identify and develop effective coping behaviors will improve Ability to maintain clinical measurements within normal limits will improve Compliance with prescribed medications will improve Ability to identify triggers associated with substance abuse/mental health issues will improve  Medication Management: Evaluate patient's response, side effects, and tolerance of medication regimen.  Therapeutic Interventions: 1 to 1  sessions, Unit Group sessions and Medication administration.  Evaluation of Outcomes: Progressing  Physician Treatment Plan for Secondary Diagnosis: Principal Problem:   Substance-induced disorder (HCC)  Long Term Goal(s): Improvement in symptoms so as ready for discharge   Short Term Goals: Ability to identify changes in lifestyle to reduce recurrence of condition will improve Ability to verbalize feelings will improve Ability to disclose and discuss suicidal ideas Ability to demonstrate self-control will  improve Ability to identify and develop effective coping behaviors will improve Ability to maintain clinical measurements within normal limits will improve Compliance with prescribed medications will improve Ability to identify triggers associated with substance abuse/mental health issues will improve     Medication Management: Evaluate patient's response, side effects, and tolerance of medication regimen.  Therapeutic Interventions: 1 to 1 sessions, Unit Group sessions and Medication administration.  Evaluation of Outcomes: Progressing   RN Treatment Plan for Primary Diagnosis: Substance-induced disorder (HCC) Long Term Goal(s): Knowledge of disease and therapeutic regimen to maintain health will improve  Short Term Goals: Ability to remain free from injury will improve, Ability to verbalize frustration and anger appropriately will improve, Ability to demonstrate self-control, Ability to participate in decision making will improve, Ability to verbalize feelings will improve, Ability to disclose and discuss suicidal ideas, Ability to identify and develop effective coping behaviors will improve, and Compliance with prescribed medications will improve  Medication Management: RN will administer medications as ordered by provider, will assess and evaluate patient's response and provide education to patient for prescribed medication. RN will report any adverse and/or side effects to prescribing provider.  Therapeutic Interventions: 1 on 1 counseling sessions, Psychoeducation, Medication administration, Evaluate responses to treatment, Monitor vital signs and CBGs as ordered, Perform/monitor CIWA, COWS, AIMS and Fall Risk screenings as ordered, Perform wound care treatments as ordered.  Evaluation of Outcomes: Progressing   LCSW Treatment Plan for Primary Diagnosis: Substance-induced disorder [facility]) Long Term Goal(s): Safe transition to appropriate next level of care at discharge, Engage patient  in therapeutic group addressing interpersonal concerns.  Short Term Goals: Engage patient in aftercare planning with referrals and resources, Increase social support, Increase ability to appropriately verbalize feelings, Increase emotional regulation, Facilitate acceptance of mental health diagnosis and concerns, Facilitate patient progression through stages of change regarding substance use diagnoses and concerns, Identify triggers associated with mental health/substance abuse issues, and Increase skills for wellness and recovery  Therapeutic Interventions: Assess for all discharge needs, 1 to 1 time with Social worker, Explore available resources and support systems, Assess for adequacy in community support network, Educate family and significant other(s) on suicide prevention, Complete Psychosocial Assessment, Interpersonal group therapy.  Evaluation of Outcomes: Progressing   Progress in Treatment: Attending groups: Yes. Participating in groups: Yes. Taking medication as prescribed: Yes. Toleration medication: Yes. Family/Significant other contact made: Yes, individual(s) contacted:  [name], mother at [phone]  Patient understands diagnosis: Yes. Discussing patient identified problems/goals with staff: Yes. Medical problems stabilized or resolved: Yes. Denies suicidal/homicidal ideation: No. Issues/concerns per patient self-inventory: Yes. Other: none  New problem(s) identified: No, Describe:  none  New Short Term/Long Term Goal(s):Patient to work towards detox, medication management for mood stabilization; elimination of SI thoughts; development of comprehensive mental wellness/sobriety plan.  Patient Goals:  Patient states their goal for treatment is to "get into rehab and remain sober."  Discharge Plan or Barriers: Patient interested in inpatient rehabilitation for substance use. Situation ongoing, CSW will continue to monitor and update note as more information becomes  available.  Reason for Continuation of Hospitalization: Depression Other; describe active substance use.   Estimated Length of Stay: 1-7 days    Last 3 [place] Suicide Severity Risk Score: Flowsheet Row [facility] (Current[facility] Most recent reading at 02/10/2022  4:03 PM ED from 02/09/2022 in [facility] Most recent reading at 02/09/2022  3:37 PM ED from 02/09/2022 in [facility] Most recent reading at 02/09/2022  9:35 AM  C-SSRS RISK CATEGORY Low Risk High Risk No Risk       Last PHQ 2/9 Scores:    12/20/2021   12:42 PM 12/18/2021   10:32 PM  Depression screen PHQ 2/9  Decreased Interest 1 3  Down, Depressed, Hopeless 1 3  PHQ - 2 Score 2 6  Altered sleeping 1 3  Tired, decreased energy 2 3  Change in appetite 0 3  Feeling bad or failure about yourself  3 1  Trouble concentrating 1 1  Moving slowly or fidgety/restless 0 1  Suicidal thoughts 1 2  PHQ-9 Score 10 20  Difficult doing work/chores Somewhat difficult Very difficult    Scribe for Treatment Team: Almedia Balls 02/13/2022 11:06 AM

## 2022-02-13 NOTE — Progress Notes (Signed)
D) Pt received calm, visible, participating in milieu, and in no acute distress. Pt A & O x4. Pt denies SI, HI, A/ V H, depression, anxiety and pain at this time. A) Pt encouraged to drink fluids. Pt encouraged to come to staff with needs. Pt encouraged to attend and participate in groups. Pt encouraged to set reachable goals.  R) Pt remained safe on unit, in no acute distress, will continue to assess.      02/13/22 2000  Psych Admission Type (Psych Patients Only)  Admission Status Voluntary  Psychosocial Assessment  Patient Complaints None  Eye Contact Fair  Facial Expression Anxious  Affect Anxious  Speech Logical/coherent  Interaction Assertive  Motor Activity Other (Comment) (unremarkable)  Appearance/Hygiene Unremarkable  Behavior Characteristics Calm;Cooperative  Mood Pleasant  Thought Process  Coherency WDL  Content WDL  Delusions None reported or observed  Perception WDL  Hallucination None reported or observed  Judgment Limited  Confusion None  Danger to Self  Current suicidal ideation? Denies  Self-Injurious Behavior No self-injurious ideation or behavior indicators observed or expressed   Agreement Not to Harm Self Yes  Description of Agreement verbal  Danger to Others  Danger to Others None reported or observed

## 2022-02-13 NOTE — Plan of Care (Signed)
  Problem: Coping: Goal: Ability to verbalize frustrations and anger appropriately will improve Outcome: Progressing Goal: Ability to demonstrate self-control will improve Outcome: Progressing   Problem: Safety: Goal: Periods of time without injury will increase Outcome: Progressing   

## 2022-02-13 NOTE — Progress Notes (Addendum)
Spiritual care group on grief and loss facilitated by chaplain Burnis Kingfisher MDiv, BCC  Group Goal:  Support / Education around grief and loss Members engage in facilitated group support and psycho-social education.  Group Description:  Following introductions and group rules, group members engaged in facilitated group dialog and support around topic of loss, with particular support around experiences of loss in their lives. Group Identified types of loss (relationships / self / things) and identified patterns, circumstances, and changes that precipitate losses. Reflected on thoughts / feelings around loss, normalized grief responses, and recognized variety in grief experience.   Group noted Worden's four tasks of grief in discussion.  Group drew on Adlerian / Rogerian, narrative, MI, Patient Progress:  William [name] was present for first half of group.  Was called out of room by provider and did not return.  Engaged in group conversation voluntarily.  William [name] noted that grief can stem from losses other than deaths.  Stated he navigates grief by working to focus on the positive aspects of a relationship.

## 2022-02-13 NOTE — Progress Notes (Signed)
Patient is polite and pleasant on approach. Patient is compliant with routine medication, tolerated them well with no side effect noted. Patient states he would like to stay positive and keep his head up in his self inventory sheet. Patient denies SI/HI/AVH. No acute distress noted. Staff will continue to provide support to patient.  02/13/22 0934  Psych Admission Type (Psych Patients Only)  Admission Status Voluntary  Psychosocial Assessment  Patient Complaints None  Eye Contact Fair  Facial Expression Anxious  Affect Depressed;Anxious  Speech Logical/coherent  Interaction Assertive  Motor Activity Other (Comment) (wnl)  Appearance/Hygiene Unremarkable  Behavior Characteristics Cooperative  Mood Pleasant  Aggressive Behavior  Effect No apparent injury  Thought Process  Coherency WDL  Content WDL  Delusions None reported or observed  Perception WDL  Hallucination None reported or observed  Judgment Limited  Confusion None  Danger to Self  Current suicidal ideation? Denies  Self-Injurious Behavior No self-injurious ideation or behavior indicators observed or expressed   Agreement Not to Harm Self Yes  Description of Agreement verbal  Danger to Others  Danger to Others None reported or observed

## 2022-02-13 NOTE — Group Note (Signed)
Recreation Therapy Group Note   Group Topic:Stress Management  Group Date: 02/13/2022 Start Time: 0930 End Time: 0950 Facilitators: Caroll Rancher, [place] Location: [address] Dayroom   Goal Area(s) Addresses:  Patient will identify positive stress management techniques. Patient will identify benefits of using stress management post d/c.  Group Description:  Meditation.  LRT played a mountain meditation.  This meditation focused on taking on the characteristics of the mountain into meditation and everyday life.  The meditation centered on how mountains go through each season, they are able to take on those changes and remain steady through it.  The meditation brings that same steadiness into everyday living.    Affect/Mood: N/A   Participation Level: Did not attend    Clinical Observations/Individualized Feedback:     Plan: Continue to engage patient in RT group sessions 2-3x/week.   Caroll Rancher, LRT,CTRS 02/13/2022 11:08 AM

## 2022-02-14 LAB — BASIC METABOLIC PANEL
Anion gap: 10 (ref 5–15)
BUN: 9 mg/dL (ref 6–20)
CO2: 25 mmol/L (ref 22–32)
Calcium: 9.7 mg/dL (ref 8.9–10.3)
Chloride: 106 mmol/L (ref 98–111)
Creatinine, Ser: 0.98 mg/dL (ref 0.61–1.24)
GFR, Estimated: >60 mL/min (ref >60)
Glucose, Bld: 132 mg/dL — ABNORMAL HIGH (ref 70–99)
Potassium: 3.6 mmol/L (ref 3.5–5.1)
Sodium: 141 mmol/L (ref 135–145)

## 2022-02-14 MED ORDER — FLUOXETINE HCL 20 MG PO CAPS
20.0000 mg | ORAL_CAPSULE | Freq: Every day | ORAL | Status: DC
  Administered 2022-02-15: 20 mg via ORAL
  Filled 2022-02-14 (×3): qty 1

## 2022-02-14 MED ORDER — FLUOXETINE HCL 20 MG PO CAPS
20.0000 mg | ORAL_CAPSULE | Freq: Once | ORAL | Status: AC
  Administered 2022-02-14: 20 mg via ORAL
  Filled 2022-02-14: qty 1

## 2022-02-14 NOTE — Group Note (Signed)
Recreation Therapy Group Note   Group Topic:Animal Assisted Therapy   Group Date: 02/14/2022 Start Time: 1430 End Time: 1500 Facilitators: Bianna Haran, Benito Mccreedy, LRT Location: [address] Dayroom  Animal-Assisted Activity (AAA) Program Checklist/Progress Notes Patient Eligibility Criteria Checklist & Daily Group note for Rec Tx Intervention   AAA/T Program Assumption of Risk Form signed by Patient/ or Parent Legal Guardian YES  Patient is free of allergies or severe asthma  YES  Patient reports no fear of animals YES  Patient reports no history of cruelty to animals YES  Patient understands their participation is voluntary YES  Patient washes hands before animal contact YES  Patient washes hands after animal contact YES   Group Description: Patients provided opportunity to interact with trained and credentialed [employer] Therapy dog and the community volunteer/dog handler. Patients practiced appropriate animal interaction and were educated on dog safety outside of the hospital in common community settings. Patients were encouraged to socialize with one another and share about their experiences with animals and pets. Patients participated with turn taking for dog interactions with structure imposed as needed based on number of participants and quality of spontaneous participation delivered.  Goal Area(s) Addresses:  Patient will demonstrate appropriate social skills during group session.  Patient will demonstrate ability to follow instructions during group session.  Patient will identify if a reduction in stress level occurs as a result of participation in animal assisted therapy session.    Education: Charity fundraiser, Health visitor, Communication & Social Skills   Affect/Mood: Congruent and Euphoric   Participation Level: Engaged   Participation Quality: Independent   Behavior: Attentive , Cooperative, and Interactive    Modes of Intervention: Activity,  Teaching laboratory technician, and Socialization   Patient Response to Interventions:  Interested  and Receptive   Education Outcome:  Acknowledges education   Clinical Observations/Individualized Feedback: Spence appropriately pet the visiting therapy dog, Brianna during group and asked relevant questions to Teaching laboratory technician. Pt shared that they have own Rottweilers in the past and has intentions to own another in the future.     Benito Mccreedy Bawi Lakins, LRT, CTRS 02/14/2022 4:39 PM

## 2022-02-14 NOTE — BHH Counselor (Signed)
Patient has been accepted to [facility].  They would prefer to provide him transportation for a Thursday morning discharge.  Patient is to call [facility] back to learn about expectations of program and set up transportation for admission.     Ferdinando Lodge, LCSW, LCAS Clincal Social Worker  [facility]
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
## 2022-02-14 NOTE — Progress Notes (Signed)
Adult Psychoeducational Group Note  Date:  02/14/2022 Time:  8:28 PM  Group Topic/Focus:  Wrap-Up Group:   The focus of this group is to help patients review their daily goal of treatment and discuss progress on daily workbooks.  Participation Level:  Active  Participation Quality:  Appropriate  Affect:  Appropriate  Cognitive:  Appropriate  Insight: Appropriate  Engagement in Group:  Engaged  Modes of Intervention:  Discussion  Additional Comments:  patient said his day was a 10. His goal was to have a good day and he achieved his goal. His coping skills stay focused on the good stuff.  Charna Busman Long 02/14/2022, 8:28 PM

## 2022-02-14 NOTE — Progress Notes (Signed)
Pt presents with anxiety, but denies feeling depressed.  Pt denies SI/HI/AVH.  Pt using nicotine gum regularly throughout the day.  Pt taking medications without incident and no adverse reactions are noted.  RN will continue to monitor pt's progress and will provide support as indicated.  02/14/22 0930  Psych Admission Type (Psych Patients Only)  Admission Status Voluntary  Psychosocial Assessment  Patient Complaints Anxiety  Eye Contact Fair  Facial Expression Anxious  Affect Anxious  Speech Logical/coherent  Interaction Assertive  Motor Activity Other (Comment) (WDL)  Appearance/Hygiene Unremarkable  Behavior Characteristics Cooperative  Mood Pleasant  Thought Process  Coherency WDL  Content WDL  Delusions None reported or observed  Perception WDL  Hallucination None reported or observed  Judgment Limited  Confusion None  Danger to Self  Current suicidal ideation? Denies  Danger to Others  Danger to Others None reported or observed

## 2022-02-15 MED ORDER — NICOTINE POLACRILEX 2 MG MT GUM: 2.0000 mg | CHEWING_GUM | OROMUCOSAL | 0 refills | Status: AC | PRN

## 2022-02-15 MED ORDER — QUETIAPINE FUMARATE 150 MG PO TABS: 150.0000 mg | ORAL_TABLET | Freq: Every day | ORAL | 0 refills | Status: AC

## 2022-02-15 MED ORDER — FLUOXETINE HCL 20 MG PO CAPS: 20.0000 mg | ORAL_CAPSULE | Freq: Every day | ORAL | 0 refills | Status: AC

## 2022-02-15 MED ORDER — PROPRANOLOL HCL 10 MG PO TABS: 10.0000 mg | ORAL_TABLET | Freq: Two times a day (BID) | ORAL | 0 refills | Status: AC
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
## 2022-02-15 NOTE — Progress Notes (Signed)
Pt rates depression 0/10 and anxiety 0/10. "I feel better and I can tell the difference". Pt reports a good appetite, and no physical problems. Pt denies SI/HI/AVH and verbally contracts for safety. Provided support and encouragement. Pt safe on the unit. Q 15 minute safety checks continued.

## 2022-02-15 NOTE — Progress Notes (Signed)
BP 128/89 (BP Location: Right Arm)   Pulse 98   Temp 98.1 F (36.7 C) (Oral)   Resp 16   Ht 6\' 4"  (1.93 m)   Wt 90.9 kg   SpO2 94%   BMI 24.38 kg/m   Patient is discharging at this time. Patient is A&Ox4. Vs stable. Patient denies SI,HI, and A/V/H with no plan/intent. Printed AVS reviewed with and given to patient along with medications and follow up appointments. Patient verbalized all understanding. All valuables/belongings returned to patient. Patient is being transported by rehab transportation. Patient denies any pain/discomfort. No s/s of current distress.

## 2022-02-15 NOTE — Progress Notes (Signed)
  [facility] Adult Case Management Discharge Plan :  Will you be returning to the same living situation after discharge:  No. Patient will be going to rehab At discharge, do you have transportation home?: Yes,  rehab facility is picking patient up Do you have the ability to pay for your medications: Yes,  insurance  Release of information consent forms completed and in the chart;  Patient's signature needed at discharge.  Patient to Follow up at:  Follow-up Information     Leone Payor, FNP Follow up on 03/08/2022.   Specialty: Psychiatry Why: You have an appointment for medication management services on 03/08/22 at 11:00 am.  This will be a Virtual telehealth appointment. Contact information: [address] [place] [address] [phone]         [facility]. Go on 02/27/2022.   Why: You have an appointment with your provider for therapy services on 02/27/22 at 12:00 pm.  This appointment will be held in person, but you may call to switch to Virtual. Contact information: [address] [place], [place] [address] [phone]         [facility]. Go today.   Why: [facility] will pick you up and take you to treatment.  Please follow recommendations of treatment provider. Contact information: Elberta Spaniel: [phone],  F: [phone],  E: [email or web address] [place],  [address],  [place], [place] [address]                Next level of care provider has access to [facility] Link:no  Safety Planning and Suicide Prevention discussed: Yes,  Mother, Angelica Chessman     Has patient been referred to the Quitline?: Patient refused referral  Patient has been referred for addiction treatment: Yes, patient will be going to [facility] for residential rehab program.  Beatris Si, LCSW 02/15/2022, 10:12 AM
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
## 2022-02-20 ENCOUNTER — Telehealth (HOSPITAL_COMMUNITY): Payer: Self-pay

## 2022-02-20 NOTE — BH Assessment (Signed)
Care Management - Follow Up [facility] Discharges   Patient has been placed in an inpatient psychiatric hospital [facility]) on 02-10-2022.

## 2022-04-08 ENCOUNTER — Other Ambulatory Visit: Payer: Self-pay

## 2022-04-08 ENCOUNTER — Emergency Department (HOSPITAL_BASED_OUTPATIENT_CLINIC_OR_DEPARTMENT_OTHER)
Admission: EM | Admit: 2022-04-08 | Discharge: 2022-04-08 | Disposition: A | Discharge status: Home / Self Care | Payer: BC Managed Care – PPO | Attending: Emergency Medicine | Admitting: Emergency Medicine

## 2022-04-08 ENCOUNTER — Encounter (HOSPITAL_BASED_OUTPATIENT_CLINIC_OR_DEPARTMENT_OTHER): Payer: Self-pay | Admitting: Emergency Medicine

## 2022-04-08 DIAGNOSIS — F1290 Cannabis use, unspecified, uncomplicated: Secondary | ICD-10-CM | POA: Insufficient documentation

## 2022-04-08 DIAGNOSIS — R739 Hyperglycemia, unspecified: Secondary | ICD-10-CM | POA: Insufficient documentation

## 2022-04-08 DIAGNOSIS — R45851 Suicidal ideations: Secondary | ICD-10-CM | POA: Diagnosis not present

## 2022-04-08 DIAGNOSIS — F4310 Post-traumatic stress disorder, unspecified: Secondary | ICD-10-CM | POA: Insufficient documentation

## 2022-04-08 DIAGNOSIS — F19959 Other psychoactive substance use, unspecified with psychoactive substance-induced psychotic disorder, unspecified: Secondary | ICD-10-CM | POA: Diagnosis not present

## 2022-04-08 DIAGNOSIS — Z20822 Contact with and (suspected) exposure to covid-19: Secondary | ICD-10-CM | POA: Insufficient documentation

## 2022-04-08 DIAGNOSIS — F332 Major depressive disorder, recurrent severe without psychotic features: Secondary | ICD-10-CM | POA: Diagnosis present

## 2022-04-08 DIAGNOSIS — E876 Hypokalemia: Secondary | ICD-10-CM | POA: Diagnosis not present

## 2022-04-08 DIAGNOSIS — F22 Delusional disorders: Secondary | ICD-10-CM | POA: Diagnosis not present

## 2022-04-08 DIAGNOSIS — F411 Generalized anxiety disorder: Secondary | ICD-10-CM | POA: Diagnosis not present

## 2022-04-08 LAB — CBC WITH DIFFERENTIAL/PLATELET
Abs Immature Granulocytes: 0.09 10*3/uL — ABNORMAL HIGH (ref 0.00–0.07)
Basophils Absolute: 0 10*3/uL (ref 0.0–0.1)
Basophils Relative: 1 %
Eosinophils Absolute: 0 10*3/uL (ref 0.0–0.5)
Eosinophils Relative: 0 %
HCT: 47.5 % (ref 39.0–52.0)
Hemoglobin: 16.2 g/dL (ref 13.0–17.0)
Immature Granulocytes: 1 %
Lymphocytes Relative: 18 %
Lymphs Abs: 1.4 10*3/uL (ref 0.7–4.0)
MCH: 30.7 pg (ref 26.0–34.0)
MCHC: 34.1 g/dL (ref 30.0–36.0)
MCV: 90 fL (ref 80.0–100.0)
Monocytes Absolute: 0.4 10*3/uL (ref 0.1–1.0)
Monocytes Relative: 6 %
Neutro Abs: 5.9 10*3/uL (ref 1.7–7.7)
Neutrophils Relative %: 74 %
Platelets: 205 10*3/uL (ref 150–400)
RBC: 5.28 MIL/uL (ref 4.22–5.81)
RDW: 12.7 % (ref 11.5–15.5)
WBC: 7.9 10*3/uL (ref 4.0–10.5)
nRBC: 0 % (ref 0.0–0.2)

## 2022-04-08 LAB — COMPREHENSIVE METABOLIC PANEL
ALT: 53 U/L — ABNORMAL HIGH (ref 0–44)
AST: 48 U/L — ABNORMAL HIGH (ref 15–41)
Albumin: 4.4 g/dL (ref 3.5–5.0)
Alkaline Phosphatase: 60 U/L (ref 38–126)
Anion gap: 12 (ref 5–15)
BUN: 9 mg/dL (ref 6–20)
CO2: 22 mmol/L (ref 22–32)
Calcium: 8.8 mg/dL — ABNORMAL LOW (ref 8.9–10.3)
Chloride: 103 mmol/L (ref 98–111)
Creatinine, Ser: 1.05 mg/dL (ref 0.61–1.24)
GFR, Estimated: >60 mL/min (ref >60)
Glucose, Bld: 172 mg/dL — ABNORMAL HIGH (ref 70–99)
Potassium: 2.9 mmol/L — ABNORMAL LOW (ref 3.5–5.1)
Sodium: 137 mmol/L (ref 135–145)
Total Bilirubin: 1.1 mg/dL (ref 0.3–1.2)
Total Protein: 6.9 g/dL (ref 6.5–8.1)

## 2022-04-08 LAB — RAPID URINE DRUG SCREEN, HOSP PERFORMED
Amphetamines: NOT DETECTED
Barbiturates: NOT DETECTED
Benzodiazepines: NOT DETECTED
Cocaine: NOT DETECTED
Opiates: NOT DETECTED
Tetrahydrocannabinol: POSITIVE — AB

## 2022-04-08 LAB — RESP PANEL BY RT-PCR (FLU A&B, COVID) ARPGX2
Influenza A by PCR: NEGATIVE
Influenza B by PCR: NEGATIVE
SARS Coronavirus 2 by RT PCR: NEGATIVE

## 2022-04-08 LAB — ETHANOL: Alcohol, Ethyl (B): <10 mg/dL (ref <10)

## 2022-04-08 LAB — CK: Total CK: 223 U/L (ref 49–397)

## 2022-04-08 MED ORDER — LORAZEPAM 1 MG PO TABS: 1.0000 mg | ORAL_TABLET | ORAL | Status: DC | PRN

## 2022-04-08 MED ORDER — FLUOXETINE HCL 10 MG PO CAPS
20.0000 mg | ORAL_CAPSULE | Freq: Every day | ORAL | Status: DC
  Administered 2022-04-08: 20 mg via ORAL
  Filled 2022-04-08: qty 2

## 2022-04-08 MED ORDER — QUETIAPINE FUMARATE 50 MG PO TABS: 150.0000 mg | ORAL_TABLET | Freq: Every day | ORAL | Status: DC

## 2022-04-08 MED ORDER — ZIPRASIDONE MESYLATE 20 MG IM SOLR: 20.0000 mg | Freq: Once | INTRAMUSCULAR | Status: DC

## 2022-04-08 MED ORDER — IBUPROFEN 400 MG PO TABS: 400.0000 mg | ORAL_TABLET | Freq: Four times a day (QID) | ORAL | Status: DC | PRN

## 2022-04-08 MED ORDER — ZIPRASIDONE MESYLATE 20 MG IM SOLR
INTRAMUSCULAR | Status: AC
  Administered 2022-04-08: 20 mg via INTRAMUSCULAR
  Filled 2022-04-08: qty 20

## 2022-04-08 MED ORDER — NICOTINE 21 MG/24HR TD PT24
21.0000 mg | MEDICATED_PATCH | Freq: Every day | TRANSDERMAL | Status: DC
  Filled 2022-04-08: qty 1

## 2022-04-08 MED ORDER — POTASSIUM CHLORIDE CRYS ER 20 MEQ PO TBCR
40.0000 meq | EXTENDED_RELEASE_TABLET | Freq: Once | ORAL | Status: AC
  Administered 2022-04-08: 40 meq via ORAL
  Filled 2022-04-08: qty 2

## 2022-04-08 NOTE — Discharge Instructions (Signed)
In the emergency department for your suicidal ideation.  You were seen by the psychiatrist and cleared for outpatient follow-up.  You should follow-up with your outpatient psychiatrist or at the behavioral health urgent care within the next few days to have your symptoms rechecked.  You should try to refrain from using illicit substances as these can worsen your symptoms.  Your labs today also showed that your potassium level was slightly low and you should have this rechecked by your primary doctor.  You should return to the emergency department if you are having increasing thoughts of wanting to hurt yourself or others, you have a plan to hurt yourself, or if you have any other new or concerning symptoms.

## 2022-04-08 NOTE — BH Assessment (Signed)
Received TTS consult order. Per RN, Pt has received medication and is currently too somnolent to participate in tele-assessment. Pt will be assessed when he is able to participate.   Evelena Peat, [facility], [facility] Triage Specialist [phone]

## 2022-04-08 NOTE — ED Provider Notes (Signed)
Emergency Medicine Observation Re-evaluation Note  William [name] is a 21 y.o. male, seen on rounds today.  Pt initially presented to the ED for complaints of Paranoid Currently, the patient is asleep resting comfortably.  Physical Exam  BP (!) 152/97 (BP Location: Right Arm)   Pulse 94   Temp 98.7 F (37.1 C) (Oral)   Resp 20   SpO2 98%  Physical Exam General: Calm, asleep Cardiac: Regular rate Lungs: No increased work of breathing Psych: calm, asleep  ED Course / MDM  EKG:EKG Interpretation  Date/Time:  Saturday April 08 2022 04:04:41 EDT Ventricular Rate:  110 PR Interval:  177 QRS Duration: 107 QT Interval:  337 QTC Calculation: 456 R Axis:   100 Text Interpretation: Sinus tachycardia Borderline right axis deviation Borderline T wave abnormalities Confirmed by Ripley Fraise [phone]) on 04/08/2022 4:11:35 AM  I have reviewed the labs performed to date as well as medications administered while in observation.  Recent changes in the last 24 hours include patient was medically cleared overnight.  He was placed under IVC for suicidal ideation and agitation.  He was found to be mildly hypokalemic and was repleted with oral potassium.  He is pending psychiatry evaluation.  Plan  Current plan is for psychiatry evaluation for disposition.    Leanord Asal K, DO 04/08/22 [phone]

## 2022-04-08 NOTE — ED Notes (Signed)
TTS monitor at bedside 

## 2022-04-08 NOTE — ED Provider Notes (Signed)
[facility] EMERGENCY DEPARTMENT Provider Note   CSN: [id number] Arrival date & time: 04/08/22  0304     History  Chief Complaint - suicidal ideation  Level 5 caveat due to psychiatric disorder  William [name] is a 21 y.o. male.  The history is provided by the patient and the police. The history is limited by the condition of the patient.   Patient has a history of depression and PTSD and substance use disorder presents via Event organiser.  At reported patient called his family reporting he was a harm himself.  Per law enforcement patient became agitated and he was brought to this facility for evaluation.  No other details known on arrival    Home Medications Prior to Admission medications   Medication Sig Start Date End Date Taking? Authorizing Provider  FLUoxetine (PROZAC) 20 MG capsule Take 1 capsule (20 mg total) by mouth daily. 02/15/22   Harlow Asa, MD  nicotine polacrilex (NICORETTE) 2 MG gum Take 1 each (2 mg total) by mouth as needed for smoking cessation. 02/15/22   Harlow Asa, MD  propranolol (INDERAL) 10 MG tablet Take 1 tablet (10 mg total) by mouth 2 (two) times daily. 02/15/22   Harlow Asa, MD  QUEtiapine Fumarate 150 MG TABS Take 150 mg by mouth at bedtime. 02/15/22   Harlow Asa, MD      Allergies    Amoxicillin and Doxycycline    Review of Systems   Review of Systems  Unable to perform ROS: Psychiatric disorder    Physical Exam Updated Vital Signs BP (!) 152/97 (BP Location: Right Arm)   Pulse 94   Temp 98.7 F (37.1 C) (Oral)   Resp 20   SpO2 98%  Physical Exam CONSTITUTIONAL: Disheveled, anxious and agitated HEAD: Normocephalic/atraumatic EYES: EOMI/PERRL, pupils are dilated ENMT: Mucous membranes dry NECK: supple no meningeal signs SPINE/BACK:entire spine nontender CV: S1/S2 noted, tachycardic LUNGS: Lungs are clear to auscultation bilaterally, no apparent distress ABDOMEN: soft NEURO: Pt is awake, alert and  agitated.  He moves all extremities x4.  He is able to ambulate EXTREMITIES: pulses normal/equal, full ROM SKIN: warm PSYCH: Agitated  ED Results / Procedures / Treatments   Labs (all labs ordered are listed, but only abnormal results are displayed) Labs Reviewed  CBC WITH DIFFERENTIAL/PLATELET - Abnormal; Notable for the following components:      Result Value   Abs Immature Granulocytes 0.09 (*)    All other components within normal limits  COMPREHENSIVE METABOLIC PANEL - Abnormal; Notable for the following components:   Potassium 2.9 (*)    Glucose, Bld 172 (*)    Calcium 8.8 (*)    AST 48 (*)    ALT 53 (*)    All other components within normal limits  RESP PANEL BY RT-PCR (FLU A&B, COVID) ARPGX2  ETHANOL  CK  RAPID URINE DRUG SCREEN, HOSP PERFORMED    EKG EKG Interpretation  Date/Time:  Saturday April 08 2022 04:04:41 EDT Ventricular Rate:  110 PR Interval:  177 QRS Duration: 107 QT Interval:  337 QTC Calculation: 456 R Axis:   100 Text Interpretation: Sinus tachycardia Borderline right axis deviation Borderline T wave abnormalities Confirmed by Ripley Fraise [phone]) on 04/08/2022 4:11:35 AM  Radiology No results found.  Procedures .Critical Care  Performed by: Ripley Fraise, MD Authorized by: Ripley Fraise, MD   Critical care provider statement:    Critical care time (minutes):  59   Critical care start time:  04/08/2022 3:31 AM   Critical care end time:  04/08/2022 4:30 AM   Critical care time was exclusive of:  Separately billable procedures and treating other patients   Critical care was necessary to treat or prevent imminent or life-threatening deterioration of the following conditions:  Toxidrome and CNS failure or compromise   Critical care was time spent personally by me on the following activities:  Examination of patient, re-evaluation of patient's condition, ordering and review of laboratory studies, review of old charts, development of  treatment plan with patient or surrogate, evaluation of patient's response to treatment, pulse oximetry and ordering and performing treatments and interventions   I assumed direction of critical care for this patient from another provider in my specialty: no       Medications Ordered in ED Medications  FLUoxetine (PROZAC) capsule 20 mg (has no administration in time range)  QUEtiapine (SEROQUEL) tablet 150 mg (has no administration in time range)  LORazepam (ATIVAN) tablet 1 mg (has no administration in time range)  ibuprofen (ADVIL) tablet 400 mg (has no administration in time range)  nicotine (NICODERM CQ - dosed in mg/24 hours) patch 21 mg (has no administration in time range)  potassium chloride SA (KLOR-CON M) CR tablet 40 mEq (0 mEq Oral Hold 04/08/22 0438)  Geodon administered IM  ED Course/ Medical Decision Making/ A&P Clinical Course as of 04/08/22 0501  Sat Apr 08, 2022  0328 I was called to evaluate the patient due to his behavior.  Patient was brought in by Patent examiner.  Patient has been making threats to harm himself.  Patient is agitated and paranoid pacing around the ER.  He is a threat to himself and others.  Geodon has been ordered [DW]  0329 IVC paperwork has been completed [DW]  [phone] Patient now resting comfortably after Geodon.  Labs are pending [DW]  0436 Glucose(!): 172 Hyperglycemia [DW]  0436 Potassium(!): 2.9 Mild hypokalemia [DW]  0500 Patient with extensive history of behavioral health disorders and substance use disorder presents with agitation, paranoia and reportedly suicidal ideation.  Patient was very agitated.  He was given Geodon and he is now resting comfortably.  His heart rate is improved.  Overall labs are reassuring except for mild hypokalemia.  Once patient is awake he will need to undergo psychiatric evaluation [DW]    Clinical Course User Index [DW] Zadie Rhine, MD                           Medical Decision Making Amount and/or  Complexity of Data Reviewed Labs: ordered. Decision-making details documented in ED Course. ECG/medicine tests: ordered.  Risk OTC drugs. Prescription drug management.   This patient presents to the ED for concern of agitation, altered mental status, this involves an extensive number of treatment options, and is a complaint that carries with it a high risk of complications and morbidity.  The differential diagnosis includes but is not limited to meningitis, intracranial hemorrhage, CVA, substance-induced psychosis, alcohol intoxication, electrolyte abnormality  Comorbidities that complicate the patient evaluation: Patient's presentation is complicated by their history of depression and PTSD  Social Determinants of Health: Patient's  substance use disorder   increases the complexity of managing their presentation  Additional history obtained: Additional history obtained from  police Records reviewed previous admission documents  Lab Tests: I Ordered, and personally interpreted labs.  The pertinent results include: Hyperglycemia, mild hypokalemia  Cardiac Monitoring: The patient was maintained on a cardiac  monitor.  I personally viewed and interpreted the cardiac monitor which showed an underlying rhythm of:  sinus tachycardia  Medicines ordered and prescription drug management: I ordered medication including Geodon for agitation Reevaluation of the patient after these medicines showed that the patient    improved   Critical Interventions:  Antipsychotics  Consultations Obtained: Plan to consult behavioral health once patient is awake  Reevaluation: After the interventions noted above, I reevaluated the patient and found that they have :improved  Complexity of problems addressed: Patient's presentation is most consistent with  acute presentation with potential threat to life or bodily function  Disposition: Disposition is pending at this time         Final  Clinical Impression(s) / ED Diagnoses Final diagnoses:  Psychoactive substance-induced psychosis [facility])    Rx / DC Orders ED Discharge Orders     None         Zadie Rhine, MD 04/08/22 0501

## 2022-04-08 NOTE — ED Notes (Addendum)
D/c paperwork reviewed with pt, including f/u care. All belongings returned to pt. Pt alert and cooperative at time of discharge, ambulatory to ED exit.
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
## 2022-09-14 IMAGING — CR DG SACRUM/COCCYX 2+V
3 series · 3 of 3 positions shown · non-contrast
Comparison: None Available.

CLINICAL DATA: Possible fall? pain.

EXAM:
SACRUM AND COCCYX - 2+ VIEW

[t sacrum lat]
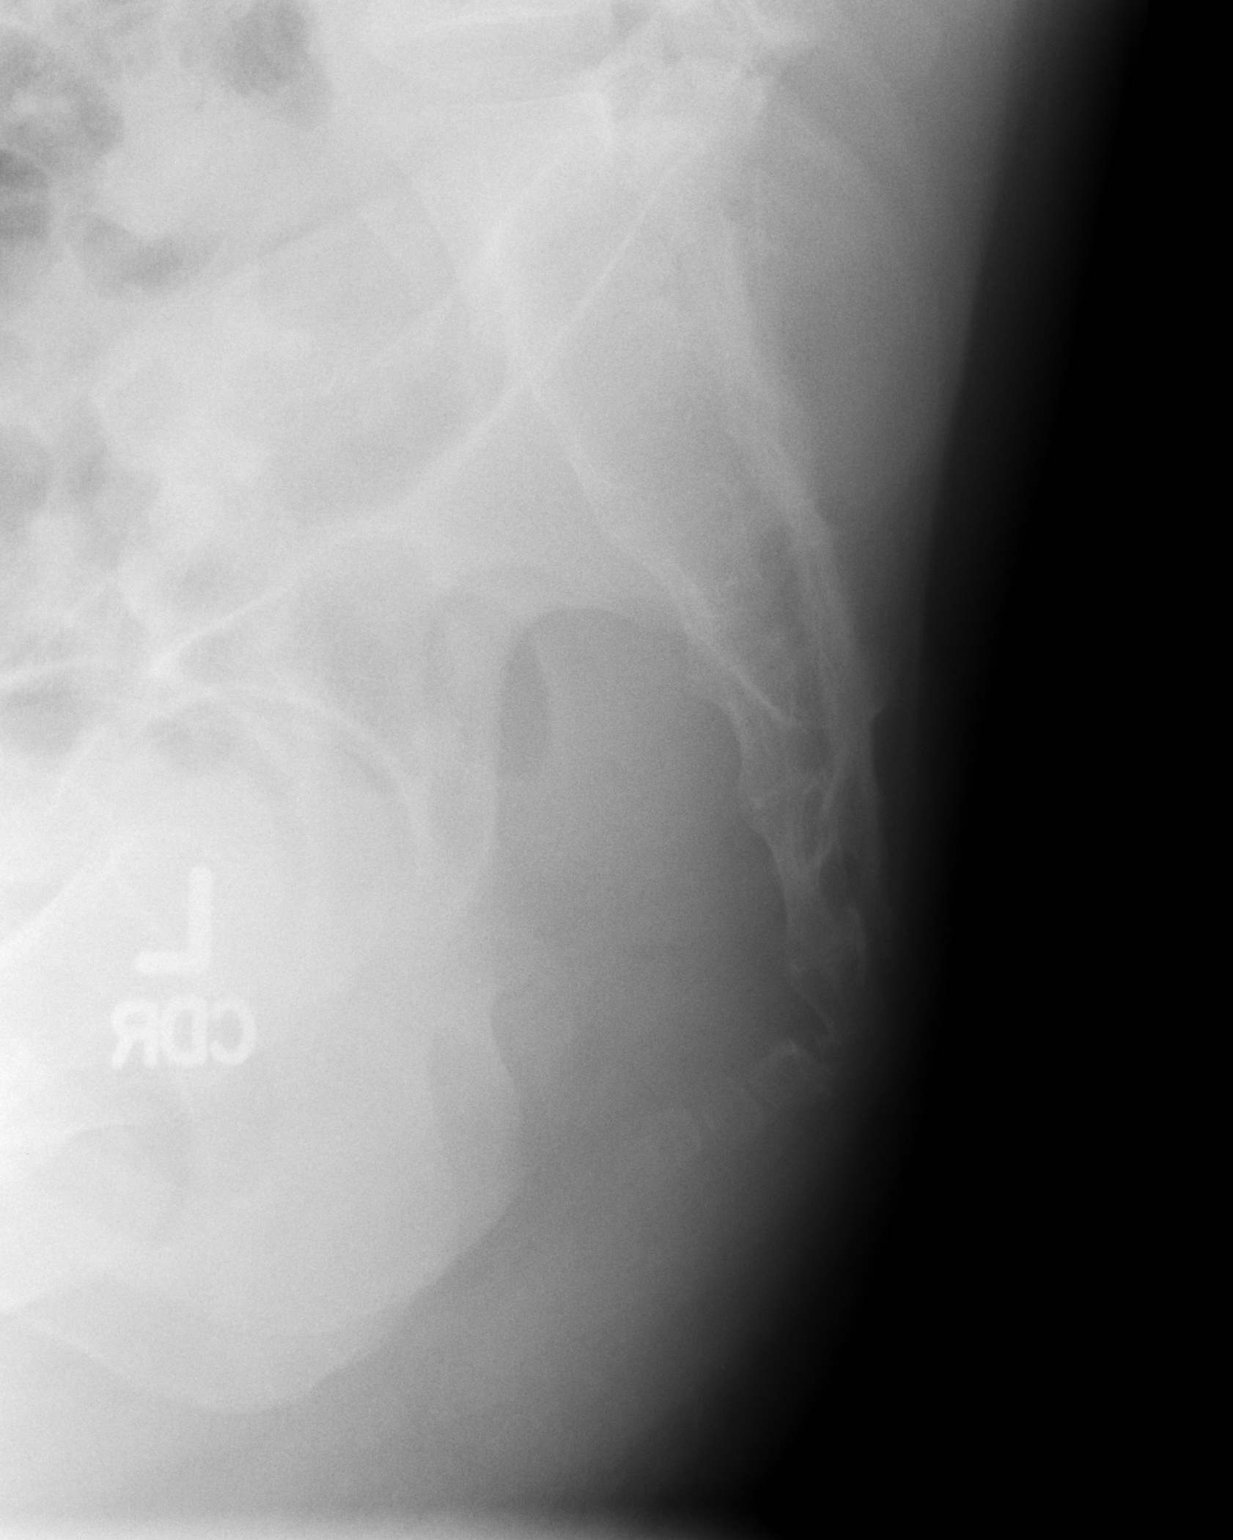

[t sacrum a.p.]
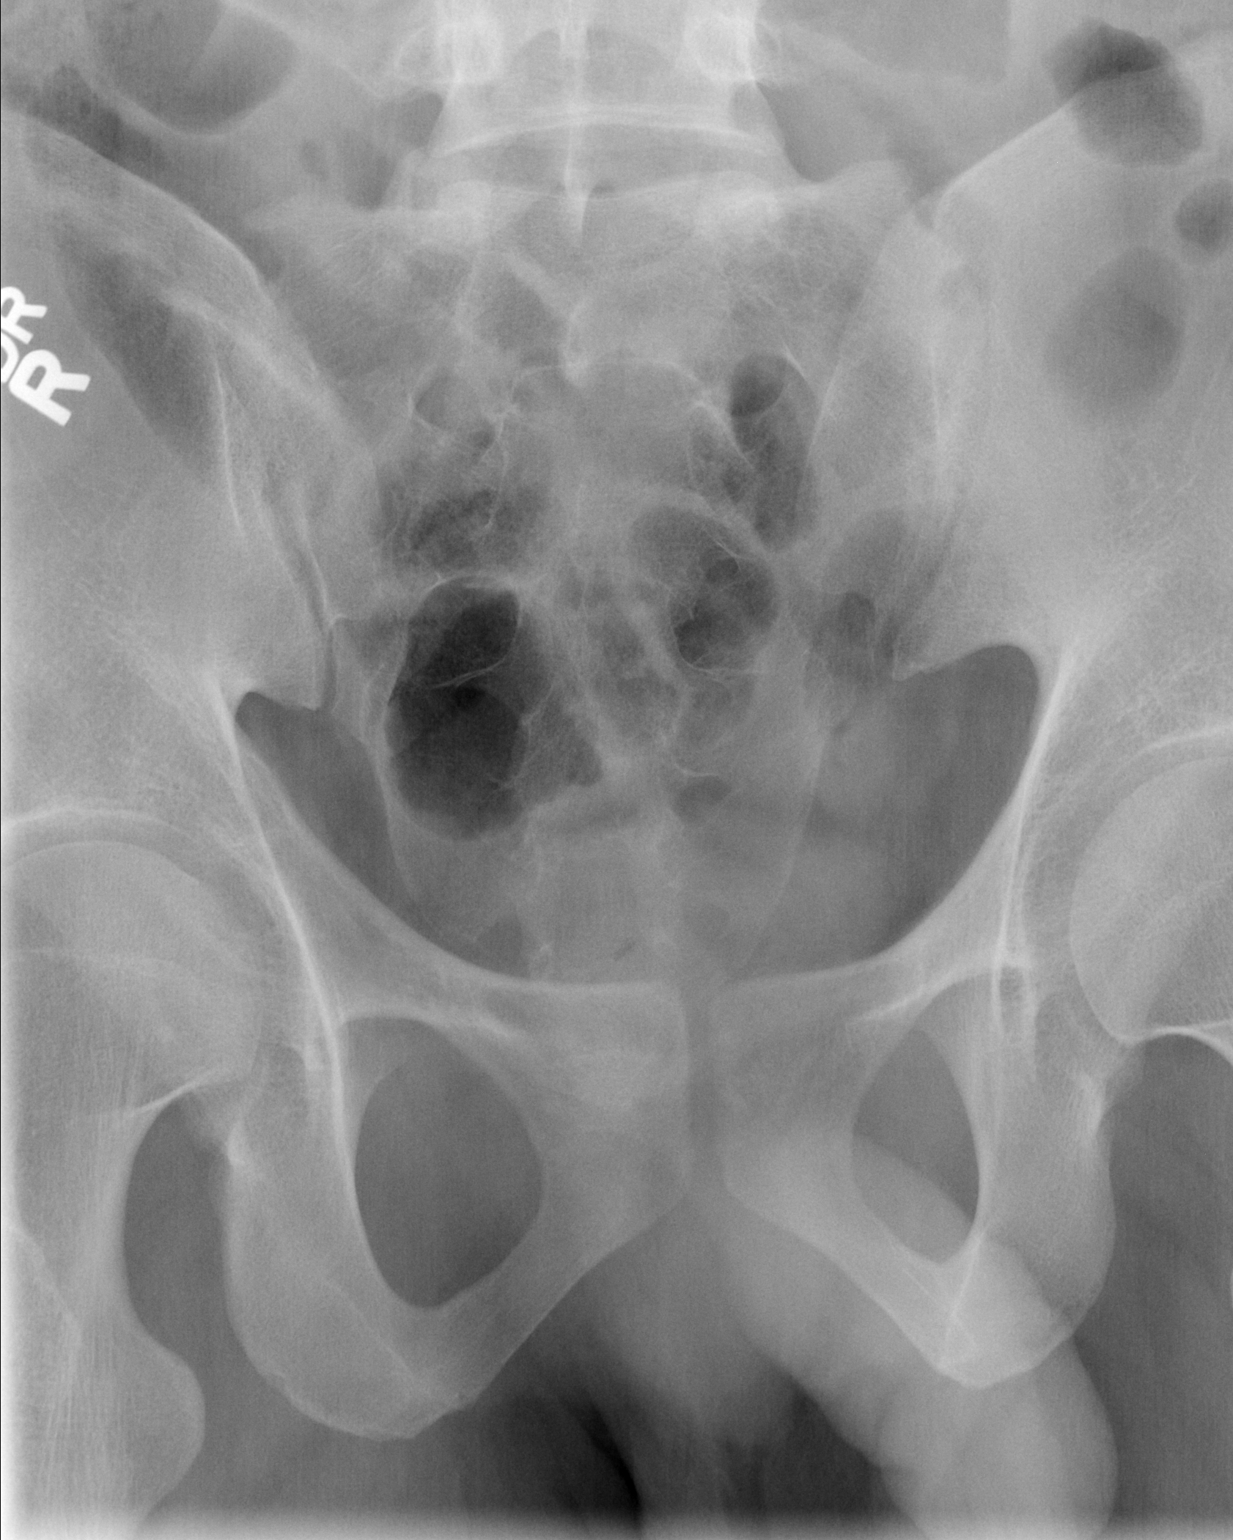

[t coccyx a.p.]
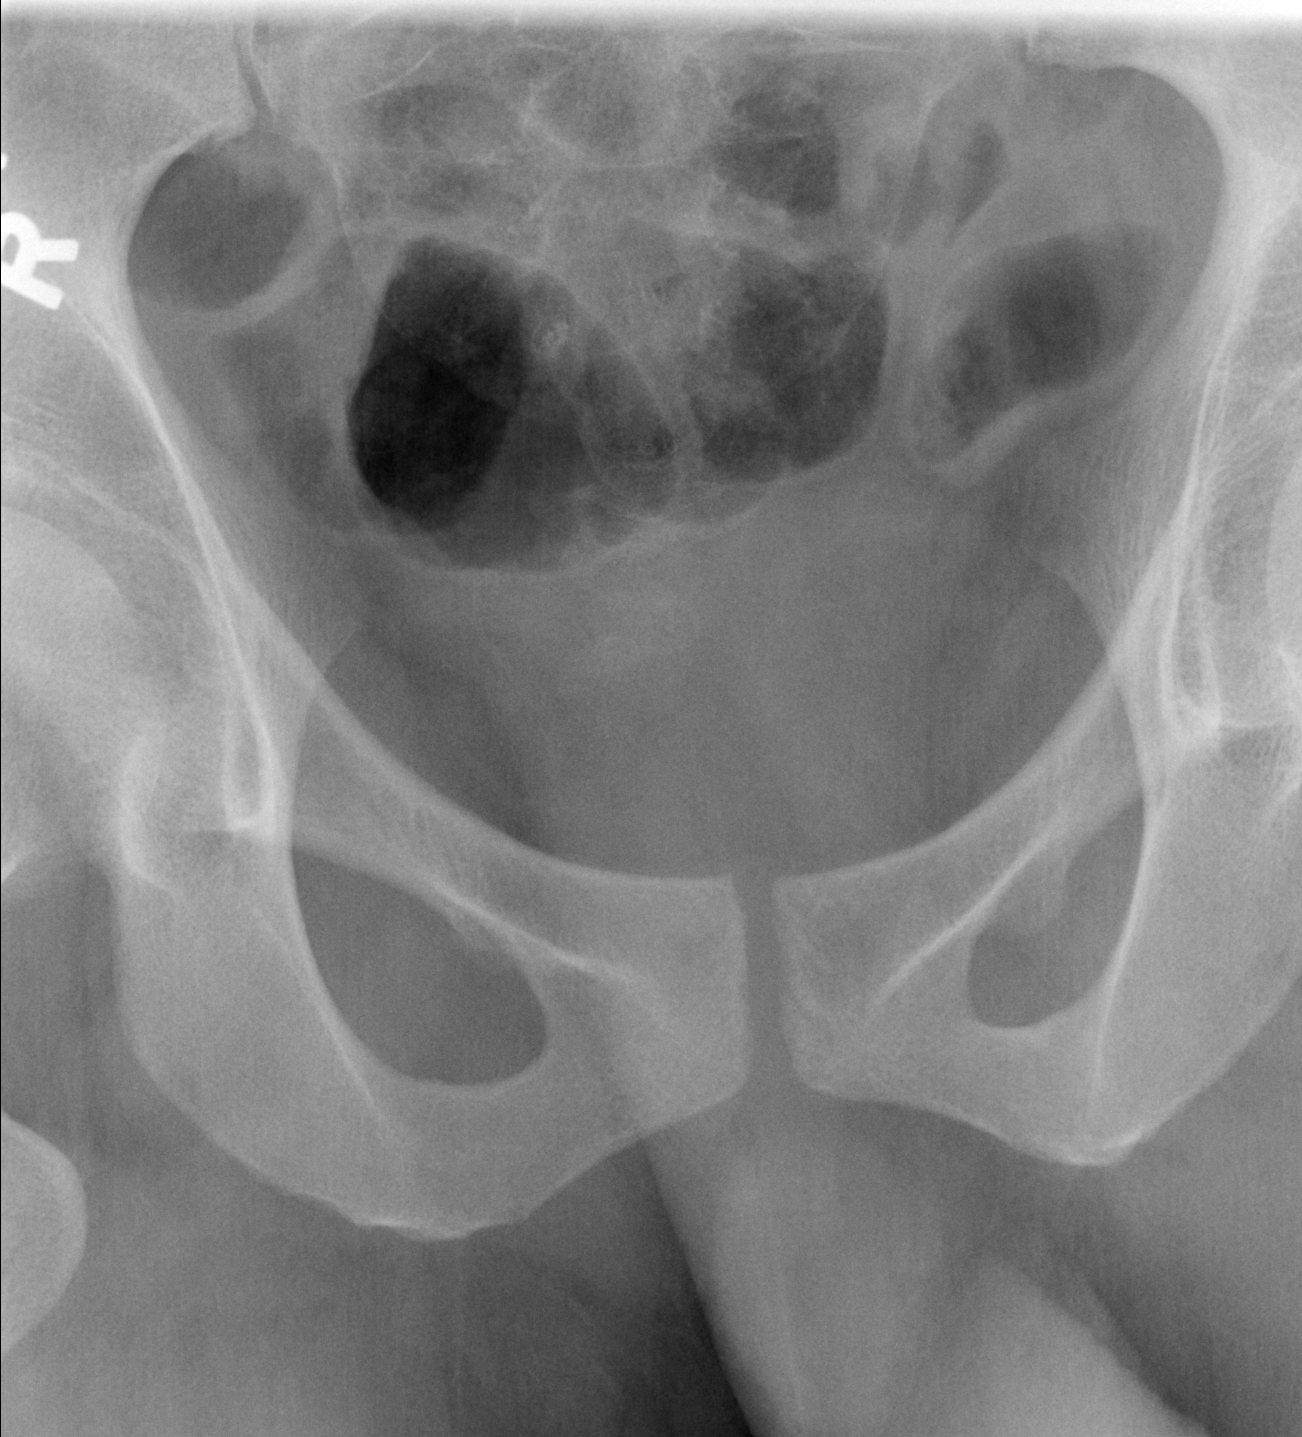

[3 of 3 positions shown; findings below may reference images not displayed]

FINDINGS: There is no evidence of fracture or other focal bone lesions.
Cortical margins of the sacrum and coccyx are intact. The sacral ala
are maintained. Sacroiliac joints are congruent.
IMPRESSION: Negative radiographs of the sacrum and coccyx.

## 2022-09-14 IMAGING — CR DG LUMBAR SPINE COMPLETE 4+V
5 series · 5 of 5 positions shown · non-contrast
Comparison: None Available.

CLINICAL DATA: Possible fall.

EXAM:
LUMBAR SPINE - COMPLETE 4+ VIEW

[t l-spine lat]
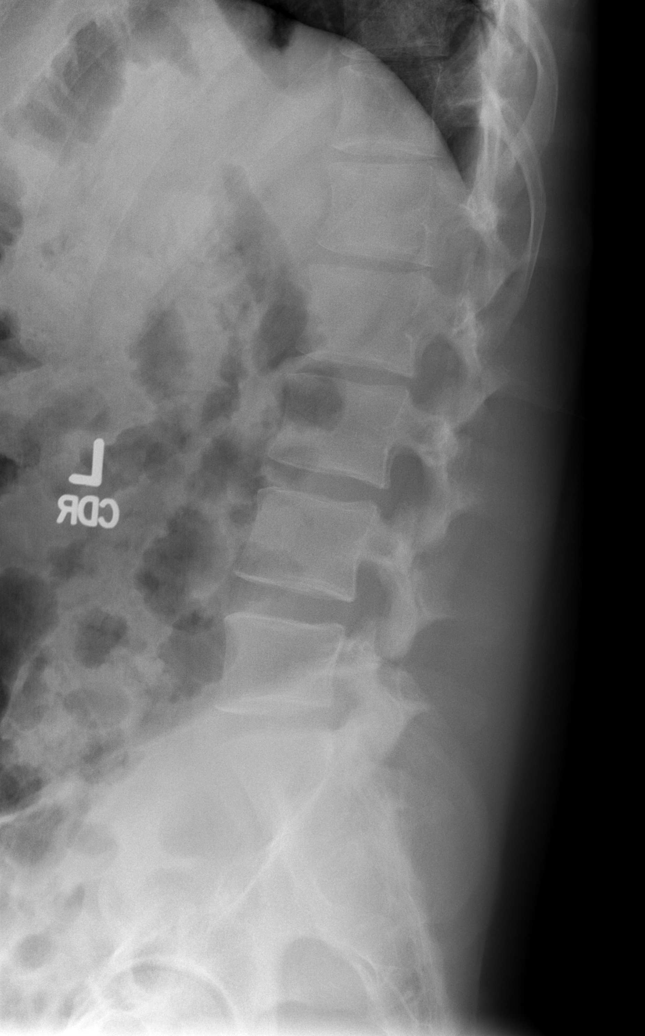

[t l-spine l5-s1 spot]
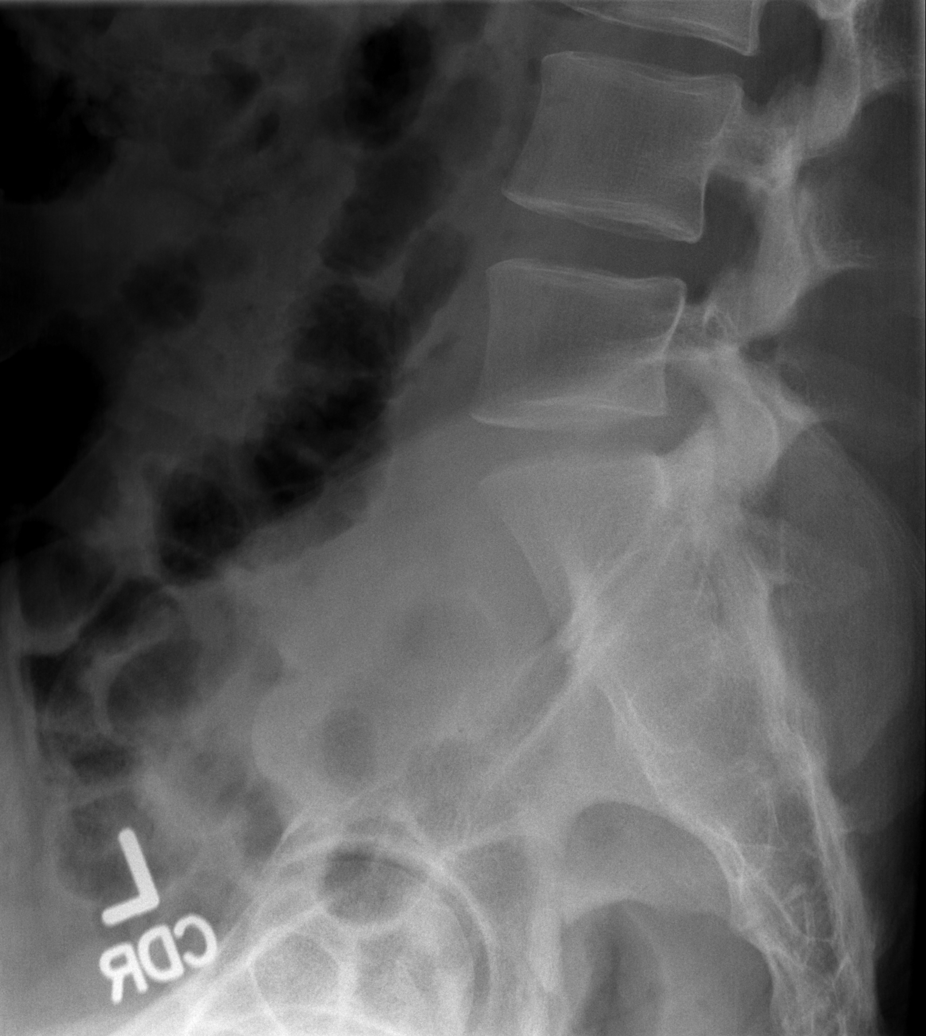

[t l-spine oblique exposure (1 of 2)]
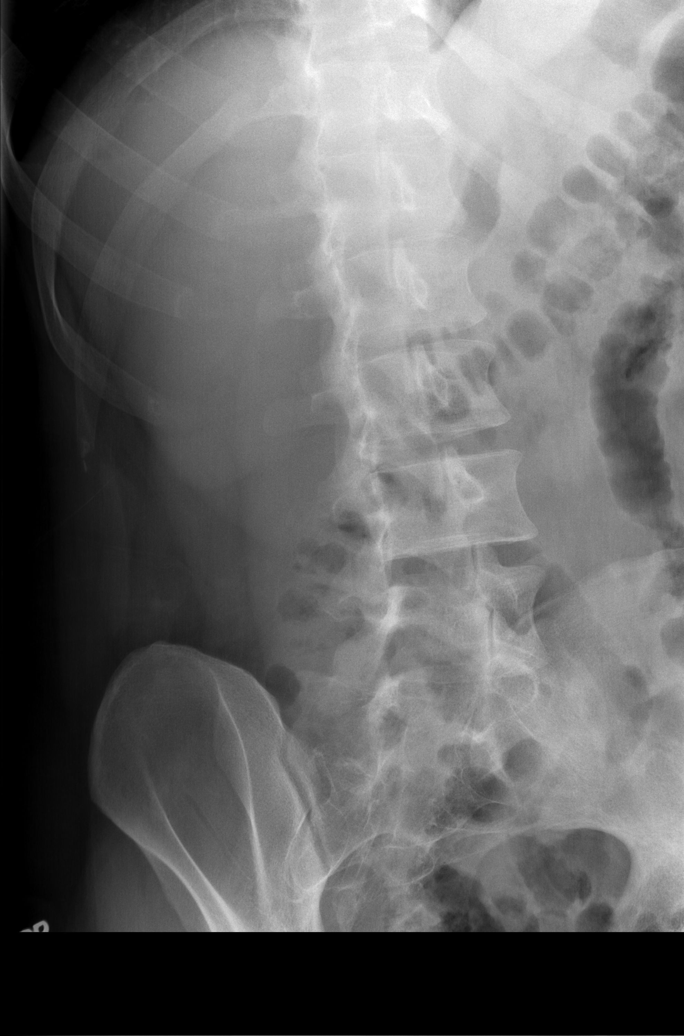

[t l-spine a.p.]
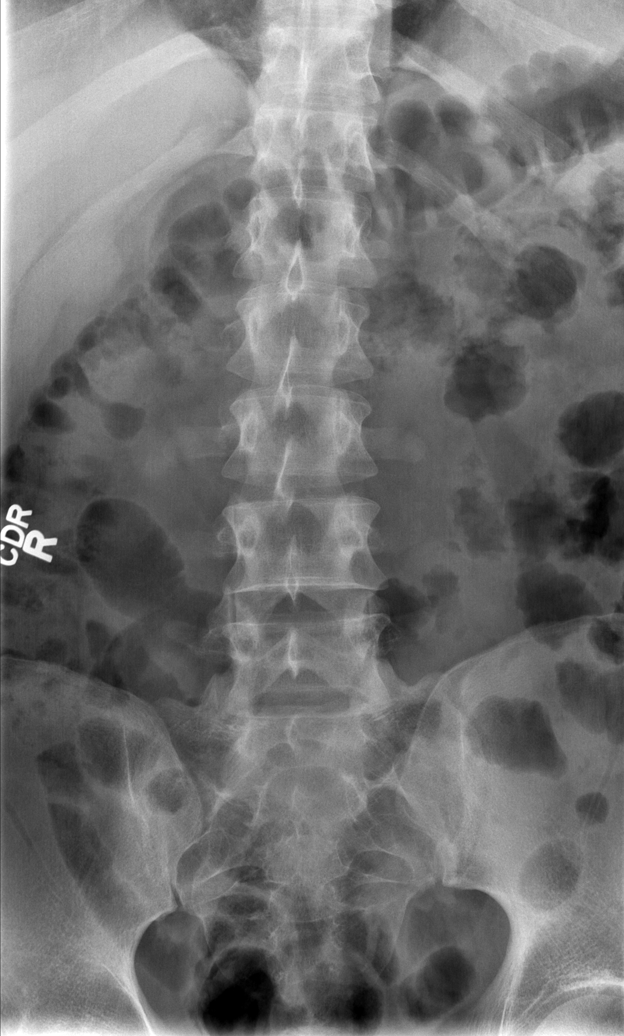

[t l-spine oblique exposure (2 of 2)]
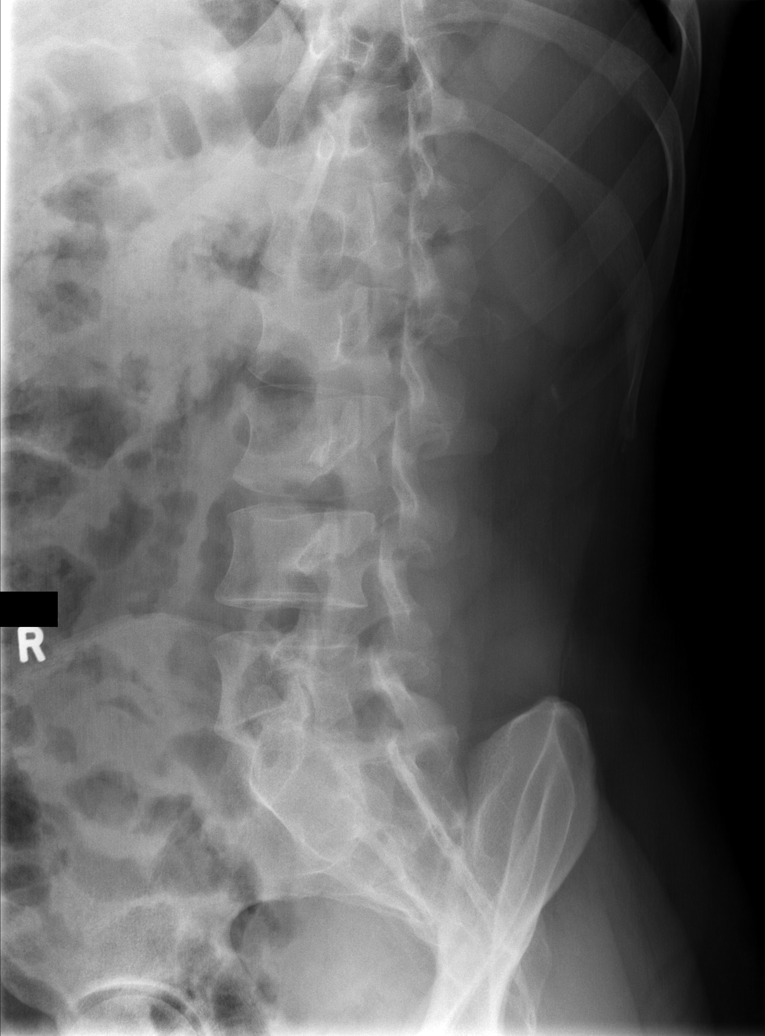

[5 of 5 positions shown; findings below may reference images not displayed]

FINDINGS: 5 non rib-bearing lumbar type vertebral bodies are present. No
significant listhesis is present. Mild straightening of the normal
lumbar lordosis is present. Minimal degenerative changes are present
at L5-S1.
IMPRESSION: 1. No acute abnormality.
2. Minimal degenerative changes at L5-S1.
3. Mild straightening of the normal lumbar lordosis.
